# Patient Record
Sex: Male | Born: 1940 | Race: White | Hispanic: No | Smoking: Never smoker
Health system: Southern US, Community
[De-identification: ages and names within clinical notes are randomized; demographics above are authoritative.]

## PROBLEM LIST (undated history)

## (undated) DIAGNOSIS — I10 Essential (primary) hypertension: Secondary | ICD-10-CM

## (undated) DIAGNOSIS — I219 Acute myocardial infarction, unspecified: Secondary | ICD-10-CM

## (undated) DIAGNOSIS — G4730 Sleep apnea, unspecified: Secondary | ICD-10-CM

## (undated) DIAGNOSIS — E079 Disorder of thyroid, unspecified: Secondary | ICD-10-CM

## (undated) DIAGNOSIS — K2270 Barrett's esophagus without dysplasia: Secondary | ICD-10-CM

## (undated) DIAGNOSIS — K219 Gastro-esophageal reflux disease without esophagitis: Secondary | ICD-10-CM

## (undated) DIAGNOSIS — M1990 Unspecified osteoarthritis, unspecified site: Secondary | ICD-10-CM

## (undated) HISTORY — DX: Barrett's esophagus without dysplasia: K22.70

## (undated) HISTORY — PX: CARPAL TUNNEL RELEASE: SHX101

## (undated) HISTORY — DX: Essential (primary) hypertension: I10

## (undated) HISTORY — PX: BACK SURGERY: SHX140

## (undated) HISTORY — DX: Acute myocardial infarction, unspecified: I21.9

## (undated) HISTORY — PX: NISSEN FUNDOPLICATION: SHX2091

## (undated) HISTORY — DX: Gastro-esophageal reflux disease without esophagitis: K21.9

## (undated) HISTORY — DX: Sleep apnea, unspecified: G47.30

## (undated) HISTORY — DX: Disorder of thyroid, unspecified: E07.9

## (undated) HISTORY — DX: Unspecified osteoarthritis, unspecified site: M19.90

## (undated) HISTORY — PX: CORONARY ANGIOPLASTY WITH STENT PLACEMENT: SHX49

---

## 2000-06-10 ENCOUNTER — Encounter (INDEPENDENT_AMBULATORY_CARE_PROVIDER_SITE_OTHER): Payer: Self-pay

## 2000-06-10 ENCOUNTER — Other Ambulatory Visit: Admission: RE | Admit: 2000-06-10 | Discharge: 2000-06-10 | Payer: Self-pay | Admitting: Internal Medicine

## 2002-09-15 ENCOUNTER — Ambulatory Visit (HOSPITAL_COMMUNITY): Admission: RE | Admit: 2002-09-15 | Discharge: 2002-09-15 | Payer: Self-pay | Admitting: Pulmonary Disease

## 2002-12-08 ENCOUNTER — Ambulatory Visit: Admission: RE | Admit: 2002-12-08 | Discharge: 2002-12-08 | Payer: Self-pay | Admitting: Pulmonary Disease

## 2003-04-12 ENCOUNTER — Ambulatory Visit (HOSPITAL_COMMUNITY): Admission: RE | Admit: 2003-04-12 | Discharge: 2003-04-12 | Payer: Self-pay | Admitting: Pulmonary Disease

## 2003-04-22 ENCOUNTER — Ambulatory Visit (HOSPITAL_COMMUNITY): Admission: RE | Admit: 2003-04-22 | Discharge: 2003-04-22 | Payer: Self-pay | Admitting: Pulmonary Disease

## 2004-01-20 ENCOUNTER — Ambulatory Visit (HOSPITAL_COMMUNITY): Admission: RE | Admit: 2004-01-20 | Discharge: 2004-01-20 | Payer: Self-pay | Admitting: Pulmonary Disease

## 2004-02-09 ENCOUNTER — Ambulatory Visit: Payer: Self-pay | Admitting: Orthopedic Surgery

## 2004-05-03 ENCOUNTER — Ambulatory Visit: Payer: Self-pay | Admitting: Orthopedic Surgery

## 2004-05-16 ENCOUNTER — Ambulatory Visit (HOSPITAL_COMMUNITY): Admission: RE | Admit: 2004-05-16 | Discharge: 2004-05-16 | Payer: Self-pay | Admitting: Orthopedic Surgery

## 2004-05-21 ENCOUNTER — Ambulatory Visit: Payer: Self-pay | Admitting: Orthopedic Surgery

## 2004-07-11 ENCOUNTER — Ambulatory Visit: Payer: Self-pay | Admitting: Internal Medicine

## 2004-07-19 ENCOUNTER — Ambulatory Visit: Payer: Self-pay | Admitting: *Deleted

## 2004-07-19 ENCOUNTER — Ambulatory Visit (HOSPITAL_COMMUNITY): Admission: RE | Admit: 2004-07-19 | Discharge: 2004-07-19 | Payer: Self-pay | Admitting: Pulmonary Disease

## 2004-08-21 ENCOUNTER — Encounter (INDEPENDENT_AMBULATORY_CARE_PROVIDER_SITE_OTHER): Payer: Self-pay | Admitting: Specialist

## 2004-08-21 ENCOUNTER — Ambulatory Visit: Payer: Self-pay | Admitting: Internal Medicine

## 2004-08-21 HISTORY — PX: UPPER GASTROINTESTINAL ENDOSCOPY: SHX188

## 2004-09-04 ENCOUNTER — Encounter: Admission: RE | Admit: 2004-09-04 | Discharge: 2004-09-19 | Payer: Self-pay | Admitting: Chiropractic Medicine

## 2004-09-07 ENCOUNTER — Encounter: Admission: RE | Admit: 2004-09-07 | Discharge: 2004-09-07 | Payer: Self-pay | Admitting: Sports Medicine

## 2004-10-22 ENCOUNTER — Encounter: Admission: RE | Admit: 2004-10-22 | Discharge: 2004-10-22 | Payer: Self-pay | Admitting: Sports Medicine

## 2004-11-06 ENCOUNTER — Encounter: Admission: RE | Admit: 2004-11-06 | Discharge: 2004-11-06 | Payer: Self-pay | Admitting: Sports Medicine

## 2004-11-20 ENCOUNTER — Encounter: Admission: RE | Admit: 2004-11-20 | Discharge: 2004-11-20 | Payer: Self-pay | Admitting: Sports Medicine

## 2004-12-17 ENCOUNTER — Ambulatory Visit (HOSPITAL_COMMUNITY): Admission: RE | Admit: 2004-12-17 | Discharge: 2004-12-17 | Payer: Self-pay | Admitting: Orthopedic Surgery

## 2005-07-22 ENCOUNTER — Ambulatory Visit (HOSPITAL_COMMUNITY): Admission: RE | Admit: 2005-07-22 | Discharge: 2005-07-22 | Payer: Self-pay | Admitting: Pulmonary Disease

## 2006-04-16 ENCOUNTER — Ambulatory Visit (HOSPITAL_COMMUNITY): Admission: RE | Admit: 2006-04-16 | Discharge: 2006-04-16 | Payer: Self-pay | Admitting: Pulmonary Disease

## 2006-11-24 ENCOUNTER — Ambulatory Visit (HOSPITAL_COMMUNITY): Admission: RE | Admit: 2006-11-24 | Discharge: 2006-11-24 | Payer: Self-pay | Admitting: Pulmonary Disease

## 2008-01-28 HISTORY — PX: UPPER GASTROINTESTINAL ENDOSCOPY: SHX188

## 2008-05-18 ENCOUNTER — Ambulatory Visit (HOSPITAL_COMMUNITY): Admission: RE | Admit: 2008-05-18 | Discharge: 2008-05-18 | Payer: Self-pay | Admitting: Pulmonary Disease

## 2008-06-28 ENCOUNTER — Ambulatory Visit: Payer: Self-pay | Admitting: Cardiology

## 2008-06-28 ENCOUNTER — Encounter: Payer: Self-pay | Admitting: Family Medicine

## 2008-06-28 ENCOUNTER — Inpatient Hospital Stay (HOSPITAL_COMMUNITY): Admission: AD | Admit: 2008-06-28 | Discharge: 2008-07-01 | Payer: Self-pay | Admitting: Internal Medicine

## 2008-07-15 ENCOUNTER — Ambulatory Visit: Payer: Self-pay | Admitting: Cardiology

## 2008-08-25 ENCOUNTER — Encounter (INDEPENDENT_AMBULATORY_CARE_PROVIDER_SITE_OTHER): Payer: Self-pay | Admitting: *Deleted

## 2008-08-25 LAB — CONVERTED CEMR LAB
ALT: 16 units/L
ALT: 16 units/L
AST: 15 units/L
AST: 15 units/L
Albumin: 4.4 g/dL
Albumin: 4.4 g/dL
Alkaline Phosphatase: 59 units/L
Alkaline Phosphatase: 59 units/L
BUN: 20 mg/dL
BUN: 20 mg/dL
CO2: 28 meq/L
CO2: 28 meq/L
Calcium: 9.3 mg/dL
Calcium: 9.3 mg/dL
Chloride: 102 meq/L
Chloride: 102 meq/L
Cholesterol: 94 mg/dL
Cholesterol: 94 mg/dL
Creatinine, Ser: 0.81 mg/dL
Creatinine, Ser: 0.81 mg/dL
Glucose, Bld: 162 mg/dL
Glucose, Bld: 162 mg/dL
HDL: 29 mg/dL
HDL: 29 mg/dL
LDL Cholesterol: 47 mg/dL
LDL Cholesterol: 47 mg/dL
Potassium: 3.7 meq/L
Potassium: 3.7 meq/L
Sodium: 141 meq/L
Sodium: 141 meq/L
Total Protein: 7.1 g/dL
Total Protein: 7.1 g/dL
Triglycerides: 90 mg/dL
Triglycerides: 90 mg/dL

## 2008-08-30 DIAGNOSIS — E039 Hypothyroidism, unspecified: Secondary | ICD-10-CM | POA: Insufficient documentation

## 2008-08-30 DIAGNOSIS — E119 Type 2 diabetes mellitus without complications: Secondary | ICD-10-CM

## 2008-08-30 DIAGNOSIS — M159 Polyosteoarthritis, unspecified: Secondary | ICD-10-CM

## 2008-08-30 DIAGNOSIS — G4730 Sleep apnea, unspecified: Secondary | ICD-10-CM | POA: Insufficient documentation

## 2008-08-30 DIAGNOSIS — K2270 Barrett's esophagus without dysplasia: Secondary | ICD-10-CM | POA: Insufficient documentation

## 2008-08-30 DIAGNOSIS — I219 Acute myocardial infarction, unspecified: Secondary | ICD-10-CM | POA: Insufficient documentation

## 2008-08-30 DIAGNOSIS — I10 Essential (primary) hypertension: Secondary | ICD-10-CM | POA: Insufficient documentation

## 2008-09-28 ENCOUNTER — Emergency Department (HOSPITAL_COMMUNITY): Admission: EM | Admit: 2008-09-28 | Discharge: 2008-09-28 | Payer: Self-pay | Admitting: Emergency Medicine

## 2009-01-27 ENCOUNTER — Encounter: Payer: Self-pay | Admitting: Cardiology

## 2009-01-27 ENCOUNTER — Inpatient Hospital Stay (HOSPITAL_COMMUNITY): Admission: EM | Admit: 2009-01-27 | Discharge: 2009-01-31 | Payer: Self-pay | Admitting: Emergency Medicine

## 2009-01-27 ENCOUNTER — Ambulatory Visit: Payer: Self-pay | Admitting: Cardiology

## 2009-01-30 ENCOUNTER — Encounter: Payer: Self-pay | Admitting: Cardiology

## 2009-02-10 ENCOUNTER — Encounter: Payer: Self-pay | Admitting: Cardiology

## 2009-02-13 ENCOUNTER — Encounter: Payer: Self-pay | Admitting: Cardiology

## 2009-03-29 ENCOUNTER — Ambulatory Visit: Payer: Self-pay | Admitting: Cardiovascular Disease

## 2009-03-29 ENCOUNTER — Encounter: Payer: Self-pay | Admitting: Internal Medicine

## 2009-03-29 ENCOUNTER — Encounter: Payer: Self-pay | Admitting: Emergency Medicine

## 2009-03-29 ENCOUNTER — Inpatient Hospital Stay (HOSPITAL_COMMUNITY): Admission: EM | Admit: 2009-03-29 | Discharge: 2009-04-04 | Payer: Self-pay | Admitting: Internal Medicine

## 2009-04-09 ENCOUNTER — Emergency Department (HOSPITAL_COMMUNITY): Admission: EM | Admit: 2009-04-09 | Discharge: 2009-04-09 | Payer: Self-pay | Admitting: Emergency Medicine

## 2009-04-10 ENCOUNTER — Telehealth (INDEPENDENT_AMBULATORY_CARE_PROVIDER_SITE_OTHER): Payer: Self-pay | Admitting: *Deleted

## 2009-04-13 ENCOUNTER — Encounter: Payer: Self-pay | Admitting: Adult Health

## 2009-04-13 ENCOUNTER — Ambulatory Visit: Payer: Self-pay | Admitting: Cardiovascular Disease

## 2009-04-13 DIAGNOSIS — I2510 Atherosclerotic heart disease of native coronary artery without angina pectoris: Secondary | ICD-10-CM

## 2009-04-13 DIAGNOSIS — I5032 Chronic diastolic (congestive) heart failure: Secondary | ICD-10-CM

## 2009-04-13 DIAGNOSIS — I429 Cardiomyopathy, unspecified: Secondary | ICD-10-CM

## 2009-05-22 ENCOUNTER — Encounter (INDEPENDENT_AMBULATORY_CARE_PROVIDER_SITE_OTHER): Payer: Self-pay | Admitting: *Deleted

## 2009-05-26 ENCOUNTER — Ambulatory Visit: Payer: Self-pay | Admitting: Cardiology

## 2009-05-26 ENCOUNTER — Ambulatory Visit (HOSPITAL_COMMUNITY): Admission: RE | Admit: 2009-05-26 | Discharge: 2009-05-26 | Payer: Self-pay | Admitting: Pulmonary Disease

## 2009-06-08 ENCOUNTER — Encounter (INDEPENDENT_AMBULATORY_CARE_PROVIDER_SITE_OTHER): Payer: Self-pay | Admitting: *Deleted

## 2009-06-09 ENCOUNTER — Encounter (INDEPENDENT_AMBULATORY_CARE_PROVIDER_SITE_OTHER): Payer: Self-pay | Admitting: *Deleted

## 2009-06-09 ENCOUNTER — Encounter: Payer: Self-pay | Admitting: Adult Health

## 2009-06-09 ENCOUNTER — Ambulatory Visit: Payer: Self-pay | Admitting: Cardiology

## 2009-06-09 DIAGNOSIS — R609 Edema, unspecified: Secondary | ICD-10-CM

## 2009-06-09 LAB — CONVERTED CEMR LAB
BUN: 17 mg/dL
BUN: 17 mg/dL (ref 6–23)
Brain Natriuretic Peptide: 74.4
CO2: 25 meq/L
CO2: 25 meq/L (ref 19–32)
Calcium: 9.5 mg/dL
Calcium: 9.5 mg/dL (ref 8.4–10.5)
Chloride: 103 meq/L
Chloride: 103 meq/L (ref 96–112)
Creatinine, Ser: 0.69 mg/dL
Creatinine, Ser: 0.69 mg/dL (ref 0.40–1.50)
Glucose, Bld: 116 mg/dL
Glucose, Bld: 116 mg/dL — ABNORMAL HIGH (ref 70–99)
Potassium: 3.8 meq/L
Potassium: 3.8 meq/L (ref 3.5–5.3)
Pro B Natriuretic peptide (BNP): 74.4 pg/mL (ref 0.0–100.0)
Sodium: 140 meq/L
Sodium: 140 meq/L (ref 135–145)

## 2009-07-07 ENCOUNTER — Encounter (INDEPENDENT_AMBULATORY_CARE_PROVIDER_SITE_OTHER): Payer: Self-pay | Admitting: *Deleted

## 2009-07-11 ENCOUNTER — Telehealth (INDEPENDENT_AMBULATORY_CARE_PROVIDER_SITE_OTHER): Payer: Self-pay

## 2009-07-14 ENCOUNTER — Ambulatory Visit: Payer: Self-pay | Admitting: Cardiology

## 2009-07-24 ENCOUNTER — Telehealth: Payer: Self-pay | Admitting: Cardiology

## 2009-08-17 ENCOUNTER — Encounter (INDEPENDENT_AMBULATORY_CARE_PROVIDER_SITE_OTHER): Payer: Self-pay | Admitting: *Deleted

## 2009-09-11 ENCOUNTER — Telehealth (INDEPENDENT_AMBULATORY_CARE_PROVIDER_SITE_OTHER): Payer: Self-pay

## 2009-09-11 ENCOUNTER — Encounter: Payer: Self-pay | Admitting: Cardiology

## 2009-10-06 ENCOUNTER — Ambulatory Visit: Payer: Self-pay | Admitting: Cardiology

## 2009-10-06 DIAGNOSIS — E785 Hyperlipidemia, unspecified: Secondary | ICD-10-CM | POA: Insufficient documentation

## 2009-11-15 ENCOUNTER — Telehealth: Payer: Self-pay | Admitting: Cardiology

## 2010-01-10 ENCOUNTER — Telehealth: Payer: Self-pay | Admitting: Cardiology

## 2010-01-23 ENCOUNTER — Ambulatory Visit: Payer: Self-pay | Admitting: Cardiology

## 2010-01-24 ENCOUNTER — Telehealth (INDEPENDENT_AMBULATORY_CARE_PROVIDER_SITE_OTHER): Payer: Self-pay

## 2010-01-24 LAB — CONVERTED CEMR LAB
BUN: 13 mg/dL (ref 6–23)
CO2: 30 meq/L (ref 19–32)
Calcium: 9.2 mg/dL (ref 8.4–10.5)
Chloride: 101 meq/L (ref 96–112)
Creatinine, Ser: 0.68 mg/dL (ref 0.40–1.50)
Glucose, Bld: 125 mg/dL — ABNORMAL HIGH (ref 70–99)
Potassium: 3.9 meq/L (ref 3.5–5.3)
Sodium: 142 meq/L (ref 135–145)

## 2010-01-25 ENCOUNTER — Encounter: Payer: Self-pay | Admitting: Cardiology

## 2010-01-30 ENCOUNTER — Telehealth: Payer: Self-pay | Admitting: Cardiology

## 2010-03-14 ENCOUNTER — Encounter: Payer: Self-pay | Admitting: Cardiology

## 2010-04-16 ENCOUNTER — Ambulatory Visit
Admission: RE | Admit: 2010-04-16 | Discharge: 2010-04-16 | Payer: Self-pay | Source: Home / Self Care | Attending: Internal Medicine | Admitting: Internal Medicine

## 2010-04-23 ENCOUNTER — Ambulatory Visit (HOSPITAL_COMMUNITY)
Admission: RE | Admit: 2010-04-23 | Discharge: 2010-04-23 | Payer: Self-pay | Source: Home / Self Care | Attending: Internal Medicine | Admitting: Internal Medicine

## 2010-04-27 ENCOUNTER — Encounter: Payer: Self-pay | Admitting: Cardiology

## 2010-05-01 NOTE — Letter (Signed)
Summary: Handout Printed  Printed Handout:  - Diet - Sodium-Controlled 

## 2010-05-01 NOTE — Assessment & Plan Note (Signed)
Summary: 1 MTH F/U PER CHECKOUT ON 06/09/09/TG   Visit Type:  Follow-up Primary Provider:  DR.EDWARD HAWKINS  CC:  No Cardiology complaints today.  History of Present Illness: Mr Johnny [name] comes in today for followup of his chronic diastolic heart failure, lower shin edema, hypertension, and coronary artery disease.  Since his last visit in March, he's lost additional pounds. That now makes 12 pounds since I saw him several months ago.  His last blood work showed a BNP of 74 and his electrolytes were stable.  He is having no symptoms other than some occasional shortness of breath. He does have some swelling in his lower 70s but is markedly improved. His wife has been very helpful and weighing him and managing his meds.  He denies any chest pain or angina. He's had no palpitations or syncope.  Current Medications (verified): 1)  Diovan 80 Mg Tabs (Valsartan) .... Take 1 Tablet By Mouth Once Daily 2)  Alprazolam 0.5 Mg Tabs (Alprazolam) .... Take 1` Tab Three Times A Day 3)  Percocet 10-325 Mg Tabs (Oxycodone-Acetaminophen) .... Take Asneeded 4)  Amlodipine Besylate 10 Mg Tabs (Amlodipine Besylate) .... Take 1 Tab Daily 5)  Morphine Sulfate 30 Mg Tabs (Morphine Sulfate) .... Take 1 Tab Three Times A Day 6)  Plavix 75 Mg Tabs (Clopidogrel Bisulfate) .... Take 1 Tab Daily 7)  Lopressor 50 Mg Tabs (Metoprolol Tartrate) .... Take 1 Tab Daily 8)  Metformin Hcl 500 Mg Tabs (Metformin Hcl) .... Take 1 Tab Two Times A Day 9)  Furosemide 40 Mg Tabs (Furosemide) .... Take 1 Tablet By Mouth Once Daily 10)  Ecotrin 325 Mg Tbec (Aspirin) .... Take 1 Tab Daily 11)  Klor-Con M10 10 Meq Cr-Tabs (Potassium Chloride Crys Cr) .... Take 2 Tablets By Mouth Two Times A Day 12)  Actos 30 Mg Tabs (Pioglitazone Hcl) .... Take 1 Tab Daily 13)  Simvastatin 40 Mg Tabs (Simvastatin) .... Take 1 Tab Daily 14)  Fish Oil 1000 Mg Caps (Omega-3 Fatty Acids) .... Take 1 Tab Daily 15)  Seroquel 50 Mg Tabs (Quetiapine  Fumarate) .... Take 1 Tab Daily 16)  Ibuprofen 200 Mg Tabs (Ibuprofen) .... Take As Needed 17)  Nitrostat 0.4 Mg Subl (Nitroglycerin) .... 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  Alcohol Use - no Drug Use - no  Risk Factors: Alcohol Use: 0 (04/13/2009)  Risk Factors: Smoking Status: quit > 6 months (04/13/2009)  Review of Systems       negative other than history of present illness  Vital Signs:  Patient profile:   70 year old male Weight:      258 pounds Pulse rate:   76 / minute BP sitting:   145 / 88  (right arm)  Vitals Entered By: Dreama Saa, CNA (July 14, 2009 1:11 PM)  Physical Exam  General:  obese.   Head:  normocephalic and atraumatic Eyes:  PERRLA/EOM intact; conjunctiva and lids normal. Neck:  Neck supple, no JVD. No masses, thyromegaly or abnormal cervical nodes. Chest Kathlee Barnhardt:  no deformities or breast masses noted  Lungs:  Clear bilaterally to auscultation and percussion. Heart:  PMI poorly appreciated, irregular rate and rhythm, normal S1-S2, no carotid bruits, Msk:  decreased ROM.   Pulses:  pulses normal in all 4 extremities Extremities:  1+ left pedal edema and 1+ right pedal edema.   Neurologic:  Alert and oriented x 3. Skin:  Intact without lesions or rashes. Psych:  anxious.     EKG  Procedure date:  07/14/2009  Findings:      normal sinus  rhythm with PACs , no change  Impression & Recommendations:  Problem # 1:  EDEMA (ICD-782.3) Assessment Improved No change in his diuretics. Elevating legs again reinforced. Salt restriction and continue to weigh himself reinforced.  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 Take 1 tab daily    Plavix 75 Mg Tabs (Clopidogrel bisulfate) .Marland Kitchen... Take 1 tab daily    Lopressor 50 Mg Tabs (Metoprolol tartrate) .Marland Kitchen... Take 1 tab daily    Ecotrin 325 Mg Tbec (Aspirin) .Marland Kitchen... Take 1 tab daily    Nitrostat 0.4 Mg Subl (Nitroglycerin) .Marland Kitchen... Take as directed for chest pain  Problem # 3:  DIASTOLIC HEART FAILURE, CHRONIC (ICD-428.32) Assessment: Improved  His updated medication list for this problem includes:    Diovan 80 Mg Tabs (Valsartan) .Marland Kitchen... Take 1 tablet by mouth once daily    Amlodipine Besylate 10 Mg Tabs (Amlodipine besylate) .Marland Kitchen... Take 1 tab daily    Plavix 75 Mg Tabs (Clopidogrel bisulfate) .Marland Kitchen... Take 1 tab daily    Lopressor 50 Mg Tabs (Metoprolol tartrate) .Marland Kitchen... Take 1 tab daily    Furosemide 40 Mg Tabs (Furosemide) .Marland Kitchen... Take 1 tablet by mouth once daily    Ecotrin 325 Mg Tbec (Aspirin) .Marland Kitchen... Take 1 tab daily    Nitrostat 0.4 Mg Subl (Nitroglycerin) .Marland Kitchen... Take as directed for chest pain  Problem # 4:  HYPERTENSION (ICD-401.9) Assessment: Unchanged  His updated medication list for this problem includes:    Diovan 80 Mg Tabs (Valsartan) .Marland Kitchen... Take 1 tablet by mouth once daily    Amlodipine Besylate 10 Mg Tabs (Amlodipine besylate) .Marland Kitchen... Take 1 tab daily    Lopressor 50 Mg Tabs (Metoprolol tartrate) .Marland Kitchen... Take 1 tab daily    Furosemide 40 Mg Tabs (Furosemide) .Marland Kitchen... Take 1 tablet by mouth once daily    Ecotrin 325 Mg Tbec (Aspirin) .Marland Kitchen... Take 1 tab daily  Patient Instructions: 1)  Your physician recommends that you  schedule a follow-up appointment in:  2)  Your physician recommends that you continue on your current medications as directed. Please refer to the Current Medication list given to you today. 3)  Your physician recommends that you weigh, daily, at the same time every day, and in the same amount of clothing.  Please record your daily weights on the handout provided and bring it to your next appointment. 4)  Please read the handout/brochure that you were given about Low salt diet

## 2010-05-01 NOTE — Progress Notes (Signed)
**Note De-Identified [name] Obfuscation** Summary: PT TOOK MEDS TWICE THIS AM  Phone Note Call from Patient Call back at Home Phone [phone]   Caller: PT Reason for Call: Talk to Nurse Summary of Call: PT TOOK DOUBLE MEDS THIS MORNING ONCE AT 7:30 AND AGAIN AT 12:00 OF FUROSIMIDE 40MG , MEFORMIN, PLAVIX, METROPRLOL AND K Initial call taken by: [name],  July 11, 2009 12:20 PM  Follow-up for Phone Call        Pt's wife states that pt. got confused and took morning meds twice this morning. [name] advised to monitor pt's BP and BS at least once an hour for 4 hrs. and to take him to ER if his SBP drops to 90 and/or BS drops to 90. [name] states she understands instructions given. Follow-up by: [name] LPN,  July 11, 2009 1:08 PM     Appended Document: PT TOOK MEDS TWICE THIS AM agree. eat some extra potassium rich foods today.  Appended Document: PT TOOK MEDS TWICE THIS AM PT'S WIFE AWARE./CY

## 2010-05-01 NOTE — Progress Notes (Signed)
Summary: pain at cath site  Phone Note Call from Patient Call back at Home Phone [phone]   Reason for Call: Talk to Nurse Summary of Call: pt had cath last monday and has been having a hard time sitting up with out pain. Was seen at [facility] last night and everything looked good to them was told to call us this am. Patient cannot sit up still with out pain he has had two caths in past and this has never happened.  Pt wife calling back because they need for Dr Daleen Squibb to call Micadis 80mg  [facility] [address] [facility] [place] [phone]. Insurece was dening because another doctor from hospital called it in. Initial call taken by: [name],  April 10, 2009 9:20 AM    New/Updated Medications: MICARDIS 80 MG TABS (TELMISARTAN) Take one tablet by mouth daily Prescriptions: MICARDIS 80 MG TABS (TELMISARTAN) Take one tablet by mouth daily  #30 x 6   Entered by:   Teressa Lower RN   Authorized by:   Gaylord Shih, MD, [facility]   Signed by:   Teressa Lower RN on 04/10/2009   Method used:   Electronically to        [facility]* (retail)       [address]       [place], [place]  [address]       Ph: [phone]       Fax: [phone]   RxID:   [id number]   Appended Document: pain at cath site Pt is still in pain can not sit up at all and ice is not working, has appt scheduled for 04/13/09 at 2pm.

## 2010-05-01 NOTE — Assessment & Plan Note (Signed)
Summary: 1 mth f/u per checkout on 04/13/09/tg   Visit Type:  Follow-up Primary Provider:  DR.EDWARD HAWKINS  CC:  occasional sob.  History of Present Illness: Johnny [name] comes in today for close followup of his chronic diastolic heart failure.  His [facility] the office here January 13, he has gained back an additional 8 pounds. His weight today is 270. He had lost 5 pounds since his discharge at his last visit.  He feels his abdominal region is swollen as well. His bowels are moving normally. He denies any nausea or vomiting but his wife says his appetite has not been good. He has had increased swelling in his legs.  His wife says he would not let her wake him in the mornings. I reinforced that he let her do so. He states he is compliant with his medications.  Current Medications (verified): 1)  Diovan 80 Mg Tabs (Valsartan) .... Take 1 Tablet By Mouth Once Daily 2)  Alprazolam 0.5 Mg Tabs (Alprazolam) .... Take 1` Tab Three Times A Day 3)  Percocet 10-325 Mg Tabs (Oxycodone-Acetaminophen) .... Take Asneeded 4)  Amlodipine Besylate 10 Mg Tabs (Amlodipine Besylate) .... Take 1 Tab Daily 5)  Morphine Sulfate 30 Mg Tabs (Morphine Sulfate) .... Take 1 Tab Two Times A Day 6)  Plavix 75 Mg Tabs (Clopidogrel Bisulfate) .... Take 1 Tab Daily 7)  Lopressor 50 Mg Tabs (Metoprolol Tartrate) .... Take 1 Tab Daily 8)  Metformin Hcl 500 Mg Tabs (Metformin Hcl) .... Take 1 Tab Two Times A Day 9)  Furosemide 40 Mg Tabs (Furosemide) .... Take 1 Tablet By Mouth Once Daily 10)  Ecotrin 325 Mg Tbec (Aspirin) .... Take 1 Tab Daily 11)  Klor-Con M10 10 Meq Cr-Tabs (Potassium Chloride Crys Cr) .... Take 2 Tablets By Mouth Two Times A Day 12)  Actos 30 Mg Tabs (Pioglitazone Hcl) .... Take 1 Tab Daily 13)  Simvastatin 40 Mg Tabs (Simvastatin) .... Take 1 Tab Daily 14)  Fish Oil 1000 Mg Caps (Omega-3 Fatty Acids) .... Take 1 Tab Daily 15)  Seroquel 50 Mg Tabs (Quetiapine Fumarate) .... Take 1 Tab Daily 16)   Ibuprofen 200 Mg Tabs (Ibuprofen) .... 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  Alcohol Use - no Drug Use - no  Risk Factors: Alcohol Use: 0 (04/13/2009)  Risk Factors: Smoking Status: quit > 6 months (04/13/2009)  Review of Systems       negative other than history of present illness  Vital Signs:  Patient profile:   70 year old male Weight:      270 pounds Pulse rate:   62 / minute BP sitting:   162 / 86  (right arm)  Vitals Entered By: Johnny Saa, CNA (May 26, 2009 1:21 PM)  Physical Exam  General:  obese.  chronically ill-appearingobese.   Head:  normocephalic and atraumatic Eyes:   sclera are injected Neck:  Neck supple, no JVD. No masses, thyromegaly or abnormal cervical nodes. Lungs:  Clear bilaterally to auscultation and percussion. Heart:  Pdifficult to appreciate, normal S1-S2, no Abdomen:  non-tense, distended,  positive bowel sounds, no tenderness Msk:  decreased ROM.  decreased ROM.   Pulses:  pulses normal in all 4 extremities Extremities:  2+ left pedal edema and 2+ right pedal edema.  2+ left pedal edema.   Neurologic:  Alert and oriented x 3. Skin:  Intact without lesions or rashes. Psych:  depressed affect.  depressed affect.     Impression & Recommendations:  Problem # 1:  DIASTOLIC HEART FAILURE, CHRONIC (ICD-428.32) He has gained 8 pounds since his last visit  4 weeks ago. Await recent furosemide to 40 mg each morning and double his potassium. He will weigh daily. If his weight further, and his wife call back for further instructions. I'll set him up for close followup in the office in 2 weeks. His updated medication list for this problem includes:    Diovan 80 Mg Tabs (Valsartan) .Marland Kitchen... Take 1 tablet by mouth once daily    Amlodipine Besylate 10 Mg Tabs (Amlodipine besylate) .Marland Kitchen... Take 1 tab daily    Plavix 75 Mg Tabs (Clopidogrel bisulfate) .Marland Kitchen... Take 1 tab daily    Lopressor 50 Mg Tabs (Metoprolol tartrate) .Marland Kitchen... Take 1 tab daily    Furosemide 40 Mg Tabs (Furosemide) .Marland Kitchen... Take 1 tablet by mouth once daily    Ecotrin 325 Mg Tbec (Aspirin) .Marland Kitchen... Take 1 tab daily  Problem # 2:  CARDIOMYOPATHY, SECONDARY (ICD-425.9) Assessment: Unchanged  His updated medication list for this problem includes:    Diovan 80 Mg Tabs (Valsartan) .Marland Kitchen... Take 1 tablet by mouth once daily    Amlodipine Besylate 10 Mg Tabs (Amlodipine besylate) .Marland Kitchen... Take 1 tab daily    Plavix 75 Mg Tabs (Clopidogrel bisulfate) .Marland Kitchen... Take 1 tab daily    Lopressor 50 Mg Tabs (Metoprolol tartrate) .Marland Kitchen... Take 1 tab daily    Furosemide 40 Mg Tabs (Furosemide) .Marland Kitchen... Take 1 tablet by mouth once daily    Ecotrin 325 Mg Tbec (Aspirin) .Marland Kitchen... Take 1 tab daily  His updated medication list for this problem includes:    Diovan 80 Mg Tabs (Valsartan) .Marland Kitchen... Take 1 tablet by mouth once daily    Amlodipine Besylate 10 Mg Tabs (Amlodipine besylate) .Marland Kitchen... Take 1 tab daily    Plavix 75 Mg Tabs (Clopidogrel bisulfate) .Marland Kitchen... Take 1 tab daily    Lopressor 50 Mg Tabs (Metoprolol tartrate) .Marland Kitchen... Take 1 tab daily    Furosemide 40 Mg Tabs (Furosemide) .Marland Kitchen... Take 1 tablet by mouth once daily    Ecotrin 325 Mg Tbec (Aspirin) .Marland Kitchen... Take 1 tab daily  Problem # 3:  HYPERTENSION (ICD-401.9) Assessment: Deteriorated  His updated medication list for this problem includes:    Diovan 80 Mg Tabs (Valsartan) .Marland Kitchen... Take 1  tablet by mouth once daily    Amlodipine Besylate 10 Mg Tabs (Amlodipine besylate) .Marland Kitchen... Take 1 tab daily    Lopressor 50 Mg Tabs (Metoprolol tartrate) .Marland Kitchen... Take 1 tab daily    Furosemide 40 Mg Tabs (Furosemide) .Marland Kitchen... Take 1 tablet by mouth once daily    Ecotrin 325 Mg Tbec (Aspirin) .Marland Kitchen... Take 1 tab daily  His updated medication list for this problem includes:    Diovan 80 Mg Tabs (Valsartan) .Marland Kitchen... Take 1 tablet by mouth once daily    Amlodipine Besylate 10 Mg Tabs (Amlodipine besylate) .Marland Kitchen... Take 1 tab daily    Lopressor 50 Mg Tabs (Metoprolol tartrate) .Marland Kitchen... Take 1 tab daily    Furosemide 40 Mg Tabs (Furosemide) .Marland Kitchen... Take 1 tablet by mouth once daily    Ecotrin 325  Mg Tbec (Aspirin) .Marland Kitchen... Take 1 tab daily  Problem # 4:  CORONARY ATHEROSCLEROSIS, NATIVE VESSEL (ICD-414.01) Assessment: Unchanged  His updated medication list for this problem includes:    Amlodipine Besylate 10 Mg Tabs (Amlodipine besylate) .Marland Kitchen... Take 1 tab daily    Plavix 75 Mg Tabs (Clopidogrel bisulfate) .Marland Kitchen... Take 1 tab daily    Lopressor 50 Mg Tabs (Metoprolol tartrate) .Marland Kitchen... Take 1 tab daily    Ecotrin 325 Mg Tbec (Aspirin) .Marland Kitchen... Take 1 tab daily  His updated medication list for this problem includes:    Amlodipine Besylate 10 Mg Tabs (Amlodipine besylate) .Marland Kitchen... Take 1 tab daily    Plavix 75 Mg Tabs (Clopidogrel bisulfate) .Marland Kitchen... Take 1 tab daily    Lopressor 50 Mg Tabs (Metoprolol tartrate) .Marland Kitchen... Take 1 tab daily    Ecotrin 325 Mg Tbec (Aspirin) .Marland Kitchen... Take 1 tab daily  Patient Instructions: 1)  Your physician recommends that you schedule a follow-up appointment in: 2 weeks 2)  Your physician has recommended you make the following change in your medication: Increase Furosemide to 40 mg by mouth once daily (take an extra 20mg  tablet tonight) and increase Klor-con (potassium) to 2 tablets by mouth two times a day  3)  ***please weigh daily at the same time each day. If you gain 3 pounds please call this office.  Please listen to your wife. Prescriptions: KLOR-CON M10 10 MEQ CR-TABS (POTASSIUM CHLORIDE CRYS CR) TAKE 2 tablets by mouth two times a day  #120 x 6   Entered by:   Johnny [name]   Authorized by:   Gaylord Shih, MD, [facility]   Signed by:   Johnny [name] on 16/12/9602   Method used:   [facility]  [address] [address] [id number]       [address]       [place], [place]  [address]       Ph: [phone] or [phone]       Fax: [phone]   RxID:   [id number] FUROSEMIDE 40 MG TABS (FUROSEMIDE) take 1 tablet by mouth once daily  #30 x 6   Entered by:   Johnny [name]   Authorized by:   Gaylord Shih, MD, [facility]   Signed by:   Johnny [name] on 40/34/7425   Method used:   [facility]        [facility]  [address] [id number]       [address]       [place], [place]  [address]       Ph: [phone] or [phone]       Fax: [phone]   RxID:   [id number]

## 2010-05-01 NOTE — Miscellaneous (Signed)
Summary: labs cmp,lipids,08/25/2008  Clinical Lists Changes  Observations: Added new observation of CALCIUM: 9.3 mg/dL (16/12/9602 54:09) Added new observation of ALBUMIN: 4.4 g/dL (81/19/1478 29:56) Added new observation of PROTEIN, TOT: 7.1 g/dL (21/30/8657 84:69) Added new observation of SGPT (ALT): 16 units/L (08/25/2008 15:38) Added new observation of SGOT (AST): 15 units/L (08/25/2008 15:38) Added new observation of ALK PHOS: 59 units/L (08/25/2008 15:38) Added new observation of CREATININE: 0.81 mg/dL (62/95/2841 32:44) Added new observation of BUN: 20 mg/dL (04/03/7251 66:44) Added new observation of BG RANDOM: 162 mg/dL (03/47/4259 56:38) Added new observation of CO2 PLSM/SER: 28 meq/L (08/25/2008 15:38) Added new observation of CL SERUM: 102 meq/L (08/25/2008 15:38) Added new observation of K SERUM: 3.7 meq/L (08/25/2008 15:38) Added new observation of NA: 141 meq/L (08/25/2008 15:38) Added new observation of LDL: 47 mg/dL (75/64/3329 51:88) Added new observation of HDL: 29 mg/dL (41/66/0630 16:01) Added new observation of TRIGLYC TOT: 90 mg/dL (09/32/3557 32:20) Added new observation of CHOLESTEROL: 94 mg/dL (25/42/7062 37:62)

## 2010-05-01 NOTE — Progress Notes (Signed)
Summary: Concerns  Phone Note Call from Patient   Caller: Spouse Darl Pikes) Reason for Call: Talk to Nurse Summary of Call: pt's wife states his legs are really swollen / pt's wife was really upset and stated that he was getting SOB / I advised pt's wife to take pt to ER/tg Initial call taken by: Raechel Ache [facility],  September 11, 2009 8:49 AM  Follow-up for Phone Call        Mr. Roddey agreed to go to ER. He states he is having "unbearable pain in both knees which are swollen and the pain in knees is causing me to be SOB". He denies CP, nausea, diaphoresis, etc. Pt's wife will bring him to [facility] ED ASAP. ED notified.  Follow-up by: Larita Fife Via LPN,  September 11, 2009 9:32 AM

## 2010-05-01 NOTE — Miscellaneous (Signed)
Summary: labs bmp,bnp,06/09/2009  Clinical Lists Changes  Observations: Added new observation of CALCIUM: 9.5 mg/dL (14/78/2956 21:30) Added new observation of CREATININE: 0.69 mg/dL (86/57/8469 62:95) Added new observation of BUN: 17 mg/dL (28/41/3244 01:02) Added new observation of BG RANDOM: 116 mg/dL (72/53/6644 03:47) Added new observation of CO2 PLSM/SER: 25 meq/L (06/09/2009 10:59) Added new observation of CL SERUM: 103 meq/L (06/09/2009 10:59) Added new observation of K SERUM: 3.8 meq/L (06/09/2009 10:59) Added new observation of NA: 140 meq/L (06/09/2009 10:59) Added new observation of BNP: 74.4  (06/09/2009 10:59)

## 2010-05-01 NOTE — Assessment & Plan Note (Signed)
Summary: 2 wk f/u per checkout on 05/26/09/tg   Visit Type:  Follow-up Primary Provider:  DR.EDWARD HAWKINS  CC:  no complaints today.  History of Present Illness: Johnny [name] is a 70 y/o CM who we are seeing on 2 week follow-up after intiation of Lasix dose of 40mg  and increased potassium dose to daily.  He is being followed for history of chronic diastolic HF.  He was seen by Dr. Daleen Squibb and was noted at that time to have 8 lb weight gain on that visit. He is now down 11lbs.  His wife has been diligent about weighing him daily and actually doing I/O's daily, making sure that she measures his urine output by having him urinate into a urinal.  She is measuring all that he takes in as well.  He drinks about 3 liters everyday but duresis approx 1000 cc daily, with less some days and up to 2 liters other days.  His complaint is that he is thirsty all the time and can't help it that he drinks so much. His wife nags him about it alot and is very strict concerning the water he drinks.  He is breathing much better but continues to have bloating in the abdomen.  Current Medications (verified): 1)  Diovan 80 Mg Tabs (Valsartan) .... Take 1 Tablet By Mouth Once Daily 2)  Alprazolam 0.5 Mg Tabs (Alprazolam) .... Take 1` Tab Three Times A Day 3)  Percocet 10-325 Mg Tabs (Oxycodone-Acetaminophen) .... Take Asneeded 4)  Amlodipine Besylate 10 Mg Tabs (Amlodipine Besylate) .... Take 1 Tab Daily 5)  Morphine Sulfate 30 Mg Tabs (Morphine Sulfate) .... Take 1 Tab Three Times A Day 6)  Plavix 75 Mg Tabs (Clopidogrel Bisulfate) .... Take 1 Tab Daily 7)  Lopressor 50 Mg Tabs (Metoprolol Tartrate) .... Take 1 Tab Daily 8)  Metformin Hcl 500 Mg Tabs (Metformin Hcl) .... Take 1 Tab Two Times A Day 9)  Furosemide 40 Mg Tabs (Furosemide) .... Take 1 Tablet By Mouth Once Daily 10)  Ecotrin 325 Mg Tbec (Aspirin) .... Take 1 Tab Daily 11)  Klor-Con M10 10 Meq Cr-Tabs (Potassium Chloride Crys Cr) .... Take 2 Tablets By  Mouth Two Times A Day 12)  Actos 30 Mg Tabs (Pioglitazone Hcl) .... Take 1 Tab Daily 13)  Simvastatin 40 Mg Tabs (Simvastatin) .... Take 1 Tab Daily 14)  Fish Oil 1000 Mg Caps (Omega-3 Fatty Acids) .... Take 1 Tab Daily 15)  Seroquel 50 Mg Tabs (Quetiapine Fumarate) .... Take 1 Tab Daily 16)  Ibuprofen 200 Mg Tabs (Ibuprofen) .... Take As Needed  Allergies (verified): No Known Drug Allergies  Review of Systems       Thirst and abdominal bloating.  All other systems have been reviewed and are negative unless stated above.   Vital Signs:  Patient profile:   70 year old male Weight:      259 pounds Pulse rate:   58 / minute BP sitting:   155 / 85  (right arm)  Vitals Entered By: Dreama Saa, CNA (June 09, 2009 1:07 PM)  Physical Exam  General:  Well developed, well nourished, in no acute distress. Lungs:  Clear bilaterally to auscultation and percussion. Heart:  Distant with regular rhythm Abdomen:  Obese, mild distention Extremities:  1+-2+ pitting edema bilaterally in the dependent position.   Impression & Recommendations:  Problem # 1:  DIASTOLIC HEART FAILURE, CHRONIC (ICD-428.32) He is diuresing fairly well with Lasix dose.  I have encouraged him to decrease  the amount of water he drinks if possible so that we are not putting back the fluid that he is diuresing.  I have congratulated his wife for her dilgence of I/O which is better than usually occurs in the hospital setting.  We will have a BMET and BNP drawn. He will follow -up with Dr. Daleen Squibb in one month. His updated medication list for this problem includes:    Diovan 80 Mg Tabs (Valsartan) .Marland Kitchen... Take 1 tablet by mouth once daily    Amlodipine Besylate 10 Mg Tabs (Amlodipine besylate) .Marland Kitchen... Take 1 tab daily    Plavix 75 Mg Tabs (Clopidogrel bisulfate) .Marland Kitchen... Take 1 tab daily    Lopressor 50 Mg Tabs (Metoprolol tartrate) .Marland Kitchen... Take 1 tab daily    Furosemide 40 Mg Tabs (Furosemide) .Marland Kitchen... Take 1 tablet by mouth once  daily    Ecotrin 325 Mg Tbec (Aspirin) .Marland Kitchen... Take 1 tab daily  Problem # 2:  CORONARY ATHEROSCLEROSIS, NATIVE VESSEL (ICD-414.01) Assessment: Unchanged  His updated medication list for this problem includes:    Amlodipine Besylate 10 Mg Tabs (Amlodipine besylate) .Marland Kitchen... Take 1 tab daily    Plavix 75 Mg Tabs (Clopidogrel bisulfate) .Marland Kitchen... Take 1 tab daily    Lopressor 50 Mg Tabs (Metoprolol tartrate) .Marland Kitchen... Take 1 tab daily    Ecotrin 325 Mg Tbec (Aspirin) .Marland Kitchen... Take 1 tab daily  Problem # 3:  HYPERTENSION (ICD-401.9) Assessment: Improved Consider Coreg vs Lopressor if BP remains above on next visit. His updated medication list for this problem includes:    Diovan 80 Mg Tabs (Valsartan) .Marland Kitchen... Take 1 tablet by mouth once daily    Amlodipine Besylate 10 Mg Tabs (Amlodipine besylate) .Marland Kitchen... Take 1 tab daily    Lopressor 50 Mg Tabs (Metoprolol tartrate) .Marland Kitchen... Take 1 tab daily    Furosemide 40 Mg Tabs (Furosemide) .Marland Kitchen... Take 1 tablet by mouth once daily    Ecotrin 325 Mg Tbec (Aspirin) .Marland Kitchen... Take 1 tab daily  Other Orders: T-Basic Metabolic Panel [phone]) T-BNP  (B Natriuretic Peptide) [phone])  Patient Instructions: 1)  Your physician recommends that you schedule a follow-up appointment in: 1 month 2)  Your physician recommends that you return for lab work in: today

## 2010-05-01 NOTE — Progress Notes (Signed)
Summary: diovan rx refill  Phone Note Call from Patient Call back at Home Phone [phone]   Caller: pt Reason for Call: Refill Medication Summary of Call: pt needs diavan needs called in to lanes [phone]. Initial call taken by: [name],  January 10, 2010 1:11 PM    Prescriptions: DIOVAN 80 MG TABS (VALSARTAN) take 1 tablet by mouth once daily  #30 x 3   Entered by:   Larita Fife Via LPN   Authorized by:   Gaylord Shih, MD, [facility]   Signed by:   Larita Fife Via LPN on 55/73/2202   Method used:   Electronically to        [facility]* (retail)       [address]       [place], [place]  [address]       Ph: [phone]       Fax: [phone]   RxID:   [id number]
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
## 2010-05-01 NOTE — Letter (Signed)
Summary: [place]  [address] [place] [address]   Phone: [phone]  Fax: [phone]      January 25, 2010 MRN: [id number]   Johnny [name] [address] [place], [place]  [address]   Dear Mr. [facility],  Your test ordered by Selena Batten has been reviewed by your physician (or physician assistant) and was found to be normal or stable. Your physician (or physician assistant) felt no changes were needed at this time.  ____ Echocardiogram  ____ Cardiac Stress Test  __X__ Lab Work  ____ Peripheral vascular study of arms, legs or neck  ____ CT scan or X-ray  ____ Lung or Breathing test  ____ Other:  Please continue on current mediacl treatment. Thank you.   Valera Castle, MD, F.A.C.C

## 2010-05-01 NOTE — Letter (Signed)
Summary: MRI MR KNEE 09-01-09  MRI MR KNEE 09-01-09   Imported By: [name] 10/06/2009 16:41:46  _____________________________________________________________________  External Attachment:    Type:   Image     Comment:   External Document

## 2010-05-01 NOTE — Letter (Signed)
Summary: SURGICAL CLEARANCE NEEDED  SURGICAL CLEARANCE NEEDED   Imported By: [name] 09/11/2009 10:44:28  _____________________________________________________________________  External Attachment:    Type:   Image     Comment:   External Document

## 2010-05-01 NOTE — Letter (Signed)
Summary: PR OP CLEARANCE  PR OP CLEARANCE   Imported By: [name] 10/06/2009 16:41:20  _____________________________________________________________________  External Attachment:    Type:   Image     Comment:   External Document

## 2010-05-01 NOTE — Assessment & Plan Note (Signed)
Summary: F3M   Visit Type:  Follow-up Primary Provider:  DR.EDWARD HAWKINS  CC:  NO CARDIOLOGY COMPLAINTS.  History of Present Illness: Johnny [name] comes in today for preoperative clearance for knee surgery. This is scheduled in about a week or so in [place] with Dr. Terrilee Croak.  He is having no angina, orthopnea, palpitations. He is not weighing himself at home. He notes increased lower extremity edema.  He  is on Actos and his family is worried that this has been written up his increasing heart attacks recently.he says his blood sugars been running about 140.  Also note he is on simvastatin and amlodipine    Current Medications (verified): 1)  Diovan 80 Mg Tabs (Valsartan) .... Take 1 Tablet By Mouth Once Daily 2)  Alprazolam 0.5 Mg Tabs (Alprazolam) .... Take 1` Tab Three Times A Day 3)  Percocet 10-325 Mg Tabs (Oxycodone-Acetaminophen) .... Take Asneeded 4)  Amlodipine Besylate 10 Mg Tabs (Amlodipine Besylate) .... Take 1 Tab Daily 5)  Morphine Sulfate 30 Mg Tabs (Morphine Sulfate) .... Take 1 Tab Three Times A Day 6)  Plavix 75 Mg Tabs (Clopidogrel Bisulfate) .... Take 1 Tab Daily 7)  Lopressor 50 Mg Tabs (Metoprolol Tartrate) .... Take 1 Tab Daily 8)  Metformin Hcl 1000 Mg Tabs (Metformin Hcl) .... Take 1 Tablet By Mouth Two Times A Day 9)  Furosemide 80 Mg Tabs (Furosemide) .... Take 1 Tablet By Mouth Every Morning 10)  Ecotrin 325 Mg Tbec (Aspirin) .... Take 1 Tab Daily 11)  Klor-Con M10 10 Meq Cr-Tabs (Potassium Chloride Crys Cr) .... Take 2 Tablets By Mouth Two Times A Day 12)  Fish Oil 1000 Mg Caps (Omega-3 Fatty Acids) .... Take 1 Tab Daily 13)  Seroquel 50 Mg Tabs (Quetiapine Fumarate) .... Take 1 Tab Daily 14)  Ibuprofen 200 Mg Tabs (Ibuprofen) .... Take As Needed 15)  Nitrostat 0.4 Mg Subl (Nitroglycerin) .... Take As Directed For Chest Pain 16)  Lipitor 20 Mg Tabs (Atorvastatin Calcium) .... 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  Alcohol Use - no Drug Use - no  Risk Factors: Alcohol Use: 0 (04/13/2009)  Risk Factors: Smoking Status: quit > 6 months (04/13/2009)  Review of Systems       negative history of present illness  Vital Signs:  Patient profile:   70 year old male Weight:      263 pounds Pulse rate:   55 / minute BP sitting:   151 / 78  (right arm)  Vitals Entered By: Dreama Saa, CNA (October 06, 2009 10:45 AM)  Physical Exam  General:  obese.  obese.   Head:  normocephalic and atraumatic Eyes:  PERRLA/EOM intact; conjunctiva and lids normal. Neck:  Neck supple, no JVD. No masses, thyromegaly or abnormal cervical nodes. Lungs:  Clear bilaterally to auscultation and percussion. Heart:  Pdifficult to appreciate, regular rate and rhythm, no gallop Abdomen:  obesity precludes adequate assessment Msk:  decreased ROM.  decreased ROM.   Pulses:  pulses normal in all 4 extremities Extremities:  2+ left pedal  edema and 2+ right pedal edema.  2+ left pedal edema and 2+ right pedal edema.   Neurologic:  Alert and oriented x 3. Skin:  Intact without lesions or rashes. Psych:  Normal affect.   Problems:  Medical Problems Added: 1)  Dx of Hyperlipidemia-mixed  (ICD-272.4) 2)  Dx of Hyperlipidemia-mixed  (ICD-272.4)  Impression & Recommendations:  Problem # 1:  CORONARY ATHEROSCLEROSIS, NATIVE VESSEL (ICD-414.01) Assessment  Unchanged  His updated medication list for this problem includes:    Amlodipine Besylate 10 Mg Tabs (Amlodipine besylate) .Marland Kitchen... Take 1 tab daily    Plavix 75 Mg Tabs (Clopidogrel bisulfate) .Marland Kitchen... Take 1 tab daily    Lopressor 50 Mg Tabs (Metoprolol tartrate) .Marland Kitchen... Take 1 tab daily    Ecotrin 325 Mg Tbec (Aspirin) .Marland Kitchen... Take 1 tab daily    Nitrostat 0.4 Mg Subl (Nitroglycerin) .Marland Kitchen... Take as directed for chest pain  Problem # 2:  EDEMA (ICD-782.3) Assessment: Deteriorated I have asked him to weigh each day. He has gained about 6 pounds since April. I will discontinue his Actos and increase his metformin 2000 mg twice a day. I've increased his Lasix to 80 mg each morning.  Problem # 3:  DIASTOLIC HEART FAILURE, CHRONIC (ICD-428.32) Assessment: Deteriorated  His updated medication list for this problem includes:    Diovan 80 Mg Tabs (Valsartan) .Marland Kitchen... Take 1 tablet by mouth once daily    Amlodipine Besylate 10 Mg Tabs (Amlodipine besylate) .Marland Kitchen... Take 1 tab daily    Plavix 75 Mg Tabs (Clopidogrel bisulfate) .Marland Kitchen... Take 1 tab daily    Lopressor 50 Mg Tabs (Metoprolol tartrate) .Marland Kitchen... Take 1 tab daily    Furosemide 80 Mg Tabs (Furosemide) .Marland Kitchen... Take 1 tablet by mouth every morning    Ecotrin 325 Mg Tbec (Aspirin) .Marland Kitchen... Take 1 tab daily    Nitrostat 0.4 Mg Subl (Nitroglycerin) .Marland Kitchen... Take as directed for chest pain  Problem # 4:  CARDIOMYOPATHY, SECONDARY (ICD-425.9)  His updated medication list for this problem includes:    Diovan 80 Mg Tabs (Valsartan) .Marland Kitchen... Take 1 tablet by mouth once daily    Amlodipine Besylate 10 Mg Tabs (Amlodipine besylate) .Marland Kitchen... Take 1 tab daily    Plavix 75 Mg Tabs (Clopidogrel bisulfate) .Marland Kitchen... Take 1 tab daily    Lopressor 50 Mg Tabs (Metoprolol tartrate) .Marland Kitchen... Take 1 tab daily    Furosemide 80 Mg Tabs (Furosemide) .Marland Kitchen... Take 1 tablet by mouth every morning    Ecotrin 325 Mg Tbec (Aspirin) .Marland Kitchen... Take 1 tab daily    Nitrostat 0.4 Mg Subl (Nitroglycerin) .Marland Kitchen... Take as  directed for chest pain  Problem # 5:  HYPERTENSION (ICD-401.9) Assessment: Unchanged  His updated medication list for this problem includes:    Diovan 80 Mg Tabs (Valsartan) .Marland Kitchen... Take 1 tablet by mouth once daily    Amlodipine Besylate 10 Mg Tabs (Amlodipine besylate) .Marland Kitchen... Take 1 tab daily    Lopressor 50 Mg Tabs (Metoprolol tartrate) .Marland Kitchen... Take 1 tab daily    Furosemide 80 Mg Tabs (Furosemide) .Marland Kitchen... Take 1 tablet by mouth every morning    Ecotrin 325 Mg Tbec (Aspirin) .Marland Kitchen... Take 1 tab daily  Problem # 6:  MI (ICD-410.90) Assessment: Unchanged  His updated medication list for this problem includes:    Amlodipine Besylate 10 Mg Tabs (Amlodipine besylate) .Marland Kitchen... Take 1 tab daily    Plavix 75 Mg Tabs (Clopidogrel bisulfate) .Marland Kitchen... Take 1 tab daily    Lopressor 50 Mg Tabs (Metoprolol tartrate) .Marland Kitchen... Take 1 tab daily  Ecotrin 325 Mg Tbec (Aspirin) .Marland Kitchen... Take 1 tab daily    Nitrostat 0.4 Mg Subl (Nitroglycerin) .Marland Kitchen... Take as directed for chest pain  Problem # 7:  UNSPECIFIED SLEEP APNEA (ICD-780.57)  Problem # 8:  HYPERLIPIDEMIA-MIXED (ICD-272.4)  I will change him to Lipitor 40 mg a day with followup blood work in 6 weeks. The following medications were removed from the medication list:    Simvastatin 40 Mg Tabs (Simvastatin) .Marland Kitchen... Take 1 tab daily His updated medication list for this problem includes:    Lipitor 20 Mg Tabs (Atorvastatin calcium) .Marland Kitchen... Take 1 tablet by mouth at bedtime  His updated medication list for this problem includes:    Simvastatin 40 Mg Tabs (Simvastatin) .Marland Kitchen... Take 1 tab daily  Problem # 9:  DM (ICD-250.00)  The following medications were removed from the medication list:    Actos 30 Mg Tabs (Pioglitazone hcl) .Marland Kitchen... Take 1 tab daily His updated medication list for this problem includes:    Diovan 80 Mg Tabs (Valsartan) .Marland Kitchen... Take 1 tablet by mouth once daily    Metformin Hcl 1000 Mg Tabs (Metformin hcl) .Marland Kitchen... Take 1 tablet by mouth two times a day     Ecotrin 325 Mg Tbec (Aspirin) .Marland Kitchen... Take 1 tab daily  The following medications were removed from the medication list:    Actos 30 Mg Tabs (Pioglitazone hcl) .Marland Kitchen... Take 1 tab daily His updated medication list for this problem includes:    Diovan 80 Mg Tabs (Valsartan) .Marland Kitchen... Take 1 tablet by mouth once daily    Metformin Hcl 1000 Mg Tabs (Metformin hcl) .Marland Kitchen... Take 1 tablet by mouth two times a day    Ecotrin 325 Mg Tbec (Aspirin) .Marland Kitchen... Take 1 tab daily  Patient Instructions: 1)  Your physician recommends that you schedule a follow-up appointment in: 3 months 2)  Your physician recommends that you return for lab work in: 6 weeks 3)  Your physician has recommended you make the following change in your medication: Increase Furosemide to 80mg  by mouth every morning, increase Metformin to 1000mg  by mouth two times a day, stop taking Simvastatin and start taking Lipitor 20mg  po qhs and stop taking Actos. 4)  ***Please stop taking Plavix and Aspirin 1 week before surgery*** Prescriptions: LIPITOR 20 MG TABS (ATORVASTATIN CALCIUM) take 1 tablet by mouth at bedtime  #30 x 6   Entered by:   Larita Fife Via LPN   Authorized by:   Gaylord Shih, MD, [facility]   Signed by:   Larita Fife Via LPN on 16/12/9602   Method used:   Electronically to        [facility]* (retail)       [address]       [place], [place]  [address]       Ph: [phone]       Fax: [phone]   RxID:   [id number] METFORMIN HCL 1000 MG TABS (METFORMIN HCL) take 1 tablet by mouth two times a day  #60 x 6   Entered by:   Larita Fife Via LPN   Authorized by:   Gaylord Shih, MD, [facility]   Signed by:   Larita Fife Via LPN on 01/26/2535   Method used:   Electronically to        [facility]* (retail)       [address]       [place]  [place], [place]  [address]       Ph: [phone]       Fax: [phone]   RxID:   [id number] FUROSEMIDE 80 MG TABS (FUROSEMIDE) take 1 tablet by mouth every  morning  #30 x 6   Entered by:   Larita Fife Via LPN   Authorized by:   Gaylord Shih, MD, [facility]   Signed by:   Larita Fife Via LPN on 73/71/0626   Method used:   Electronically to        [facility]* (retail)       [address]       [place], [place]  [address]       Ph: [phone]       Fax: [phone]   RxID:   [id number]

## 2010-05-01 NOTE — Progress Notes (Signed)
Summary: nose bleeds on plavis and asprin  Phone Note Call from Patient Call back at Home Phone [phone]   Caller: pt Reason for Call: Talk to Nurse Summary of Call: pt has been having nose bleeds on and off, he is on plavix and asprin per pt wife. He has been getting choked on the blood in his throat. Initial call taken by: [name],  November 15, 2009 12:19 PM  Follow-up for Phone Call        Cut ASA to 81mg /d Follow-up by: Gaylord Shih, MD, [facility],  November 15, 2009 3:21 PM

## 2010-05-01 NOTE — Progress Notes (Signed)
**Note De-Identified Arienna Benegas Obfuscation** Summary: Medication Questions  Phone Note Call from Patient   Caller: Spouse Reason for Call: Talk to Nurse Summary of Call: pt's wife has concerns about new BP medication that was added at patient's office visit yesterday / the medicine is Diovan / tg Initial call taken by: Raechel Ache [facility],  January 24, 2010 8:52 AM  Follow-up for Phone Call        Wife wanted to know if pt. can take Metoprolol dose at night since he takes Diovan and Amlodipine in the am. Wife advised that if pt. has no problems taking at night, he may continue. Follow-up by: Larita Fife Indie Nickerson LPN,  January 24, 2010 10:01 AM

## 2010-05-01 NOTE — Progress Notes (Signed)
Summary: Question  Phone Note Call from Patient   Caller: Spouse Reason for Call: Talk to Nurse Complaint: Urinary/GYN Problems Summary of Call: S:Pt would like return phone call from nurse/states it is not emergency, just had a question for the nurse/tg B:medical history: edema and chf, last office visit 07/14/2009, requested daily wts A: swelling in legs, hurting, tingling all over legs, legs are cold , no wt gain or sob, no redness in legs  R:elevate legs, low salt diet  Teressa Lower RN  July 25, 2009 11:24 AM   Initial call taken by: Raechel Ache [facility],  July 24, 2009 3:23 PM  Follow-up for Phone Call        agree with conservative measures. If still a problem early next week, see in office. Follow-up by: Gaylord Shih, MD, [facility],  July 28, 2009 11:11 AM       Appended Document: Question I spoke with pt's wife today, he is doing much better, no edema or any complaints at this time

## 2010-05-01 NOTE — Assessment & Plan Note (Signed)
Summary: eph   Visit Type:  Follow-up Primary Provider:  DR.EDWARD HAWKINS   History of Present Illness: Johnny [name] is a 25 obese CM with known history of CAD, recent admission to [facility] for A/C diastolic CHF, and PCI of LAD and right posterior descending artery on 04/03/2009.  This was completed secondary to presumed ischemic component to symoptoms of DOE and CP.  Cath showed multivessel disease with significant stenosis in the PDA as well as a small PLA.  LAD had 60% stenosis and review of films with doppler flow wire showed a FFR  of 0.74.  It was determined that he would benefit from two Xience DES to the mid LAD.  A Xience DES was also placed to to the PDA.   He was also adequately diuresed with IV Lasix and sent home on Lasix 20mg . His weight was 117 kg (257.4 lbs) on discharge. His weight is up 5lbs since discharge-per our scale at 262.  Since discharge the patient has had exquisite pain distal to the R groin site, with extensive bruising. He called our office and was advised to go to [facility] ER to have this evaluated.  The ER physician felt that it was ecchymosis only.  No change in H/H to reveal bleeding.  He was boarderline hypokalemic with a K of 3.6.  On evaluation today, he states that the pain is much better. There is still considerable bruising, but it is old and is dissapating.  He continues complaints of chronic DOE and still sleeps in his recliner at night to help with breathing and comfort of chronic back pain.  He is on multiple narcotics for chronic pain.  He admitts to sleep apnea but does not tolerate CPAP.  He is often sleepy during the day, but it may be a combination fo narcotics as well as sleep apnea.  Other than his chronic complaints, he is stable.  Preventive Screening-Counseling & Management  Alcohol-Tobacco     Alcohol drinks/day: 0     Smoking Status: quit > 6 months  Current Problems (verified): 1)  Dm  (ICD-250.00) 2)  Hypertension  (ICD-401.9) 3)  Osteoarthritis,  Generalized, Multiple Joints  (ICD-715.09) 4)  Barretts Esophagus  (ICD-530.85) 5)  Mi  (ICD-410.90) 6)  Unspecified Hypothyroidism  (ICD-244.9) 7)  Unspecified Sleep Apnea  (ICD-780.57)  Current Medications (verified): 1)  Micardis 80 Mg Tabs (Telmisartan) .... Take One Tablet By Mouth Daily 2)  Alprazolam 0.5 Mg Tabs (Alprazolam) .... Take 1` Tab Three Times A Day 3)  Percocet 10-325 Mg Tabs (Oxycodone-Acetaminophen) .... Take Asneeded 4)  Amlodipine Besylate 10 Mg Tabs (Amlodipine Besylate) .... Take 1 Tab Daily 5)  Morphine Sulfate 30 Mg Tabs (Morphine Sulfate) .... Take 1 Tab Two Times A Day 6)  Plavix 75 Mg Tabs (Clopidogrel Bisulfate) .... Take 1 Tab Daily 7)  Lopressor 50 Mg Tabs (Metoprolol Tartrate) .... Take 1 Tab Daily 8)  Metformin Hcl 500 Mg Tabs (Metformin Hcl) .... Take 1 Tab Daily 9)  Furosemide 20 Mg Tabs (Furosemide) .... Take 1 Tab Daily 10)  Ecotrin 325 Mg Tbec (Aspirin) .... Take 1 Tab Daily 11)  Klor-Con M10 10 Meq Cr-Tabs (Potassium Chloride Crys Cr) .... Take 1 Tab Two Times A Day 12)  Actos 30 Mg Tabs (Pioglitazone Hcl) .... Take 1 Tab Daily 13)  Simvastatin 40 Mg Tabs (Simvastatin) .... Take 1 Tab Daily 14)  Fish Oil 1000 Mg Caps (Omega-3 Fatty Acids) .... Take 1 Tab Daily  Allergies (verified): No Known Drug Allergies  Past History:  Past medical, surgical, family and social histories (including risk factors) reviewed, and no changes noted (except as noted below).  Past Medical History: Reviewed history from 08/30/2008 and no changes required. 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 Nissen fundoplication bilateral carpel tunnel release Back Surgery  Family  History: Reviewed history from 08/30/2008 and no changes required. pt is adopted   Social History: Reviewed history from 08/30/2008 and no changes required. Patient has never smoked.  Alcohol Use - no Drug Use - no Smoking Status:  quit > 6 months Alcohol drinks/day:  0  Review of Systems       The patient complains of dyspnea on exertion.         R. leg ecchymosis with pain.  Chronic back pain.  All other systems have been reviewed and are negative unless stated above.   Vital Signs:  Patient profile:   70 year old male Height:      71 inches Weight:      262 pounds BMI:     36.67 Pulse rate:   81 / minute BP sitting:   178 / 87  (right arm)  Vitals Entered By: Dreama Saa, CNA (April 13, 2009 1:54 PM)  Physical Exam  General:  Well developed, well nourished, in no acute distress. Obese Head:  normocephalic and atraumatic Eyes:  PERRLA/EOM intact; conjunctiva and lids normal. Neck:  Obese Lungs:  Clear bilaterally to auscultation and percussion. Heart:  Distant HS, 1/6 systolic murmur Abdomen:  Very obese, non distended with normal bowel sounds. Msk:  Back pain is prominent with any movement.  Extremities:  Examination of the R leg shows no hematoma, old bruising which is dissapating toward the medial side of the R knee is noted.  It is soft.  No bruit is appreciated. Neurologic:  Alert and oriented x 3. Psych:  Normal affect.   Impression & Recommendations:  Problem # 1:  DIASTOLIC HEART FAILURE, CHRONIC (ICD-428.32) Johnny [name] is up approximately 5 lbs since discharge without increased symptoms.  He has no over evidence of CHF at this time.  He will continue on current dose of Lasix./Potassium.  Weigh himself daily and if he continues to gain weight without eating more, we will need to increase his Lasix dose to 40mg  daily.   His updated medication list for this problem includes:    Micardis 80 Mg Tabs (Telmisartan) .Marland Kitchen... Take one tablet by mouth daily     Amlodipine Besylate 10 Mg Tabs (Amlodipine besylate) .Marland Kitchen... Take 1 tab daily    Plavix 75 Mg Tabs (Clopidogrel bisulfate) .Marland Kitchen... Take 1 tab daily    Lopressor 50 Mg Tabs (Metoprolol tartrate) .Marland Kitchen... Take 1 tab daily    Furosemide 20 Mg Tabs (Furosemide) .Marland Kitchen... Take 1 tab daily    Ecotrin 325 Mg Tbec (Aspirin) .Marland Kitchen... Take 1 tab daily  Problem # 2:  CARDIOMYOPATHY, SECONDARY (ICD-425.9) Diastolic dysfuction is noted without ischemic component.  Continue BB.  and Amlodipine. His updated medication list for this problem includes:    Micardis 80 Mg Tabs (Telmisartan) .Marland Kitchen... Take one tablet by mouth daily    Amlodipine Besylate 10 Mg Tabs (Amlodipine besylate) .Marland Kitchen... Take 1 tab daily    Plavix 75 Mg Tabs (Clopidogrel bisulfate) .Marland Kitchen... Take 1 tab daily    Lopressor 50 Mg Tabs (Metoprolol  tartrate) .Marland Kitchen... Take 1 tab daily    Furosemide 20 Mg Tabs (Furosemide) .Marland Kitchen... Take 1 tab daily    Ecotrin 325 Mg Tbec (Aspirin) .Marland Kitchen... Take 1 tab daily  Problem # 3:  HYPERTENSION (ICD-401.9) His BP is not optimal for patient with Diabetes and CAD.  He is not taking his Micardis, and is intolerant to Benicar because of cough per patient.  I have given him samples of Diovan 80mg  to take daily.  We will see him in one month to evaluate BP.  Needs BMET before being seen. His updated medication list for this problem includes:    Micardis 80 Mg Tabs (Telmisartan) .Marland Kitchen... Take one tablet by mouth daily    Amlodipine Besylate 10 Mg Tabs (Amlodipine besylate) .Marland Kitchen... Take 1 tab daily    Lopressor 50 Mg Tabs (Metoprolol tartrate) .Marland Kitchen... Take 1 tab daily    Furosemide 20 Mg Tabs (Furosemide) .Marland Kitchen... Take 1 tab daily    Ecotrin 325 Mg Tbec (Aspirin) .Marland Kitchen... Take 1 tab daily  Problem # 4:  CORONARY ATHEROSCLEROSIS, NATIVE VESSEL (ICD-414.01) He is asymptomatic at this time s/p DES of PDA and two to the LAD.  Continue Plavix/ASA for at least one year. His updated medication list for this problem includes:    Amlodipine Besylate 10 Mg Tabs  (Amlodipine besylate) .Marland Kitchen... Take 1 tab daily    Plavix 75 Mg Tabs (Clopidogrel bisulfate) .Marland Kitchen... Take 1 tab daily    Lopressor 50 Mg Tabs (Metoprolol tartrate) .Marland Kitchen... Take 1 tab daily    Ecotrin 325 Mg Tbec (Aspirin) .Marland Kitchen... Take 1 tab daily  Patient Instructions: 1)  Your physician recommends that you schedule a follow-up appointment in: 1 month 2)  Your physician recommends that you continue on your current medications as directed. Please refer to the Current Medication list given to you today.

## 2010-05-01 NOTE — Letter (Signed)
Summary: Endo/Colon Letter  [facility]  [address] [place], [place] [address]   Phone: [phone]  Fax: [phone]      Aug 17, 2009 MRN: [id number]   Johnny [name] [address] [place], [place]  [address]   Dear Mr. [facility],   According to your medical record, it is time for you to schedule an Endoscopy/Colonoscopy . Endoscopic screeening is recommended for patients with certain upper digestive tract conditions because of associated increased risk for cancers of the upper digestive system. The American Cancer Society recommends Colonoscopy as a method to detect early colon cancer. Patients with a family history of colon cancer, or a personal history of colon polyps or inflammatory bowel disease are at increased risk.  This letter has been generated based on the recommendations made at the time of your prior procedure. If you feel that in your particular situation this may no longer apply, please contact our office.  Please call our office at [phone] to schedule this appointment or to update your records at your earliest convenience.  Thank you for cooperating with [place] to provide you with the very best care possible.   Sincerely,  Wilhemina Bonito. Marina Goodell, M.D.   [facility] [phone]

## 2010-05-01 NOTE — Progress Notes (Signed)
Summary: BP check  Phone Note Outgoing Call   Call placed by: Larita Fife Via LPN,  January 30, 2010 4:29 PM Summary of Call: Pt. called this morning to confirm todays BP check with nurse and was told it was scheduled for 4:00pm today. Pt. and his wife came to office at 2:30, which was during clinic. Pt. and wife complained that they had to wait and left at 3:20 without being seen by nurse.  Initial call taken by: Larita Fife Via LPN,  January 30, 2010 4:35 PM  Follow-up for Phone Call        please have Molly Maduro call and explain reason for not being seen. thxs Follow-up by: Gaylord Shih, MD, [facility],  February 01, 2010 10:03 AM     Appended Document: BP check I called and talked with Chrissie Noa. His concern was that he explained to the staff that he was having a family emergency and needed to get in and out in a quickly.  After sitting in the waiting room for an hour he decided he needed to leave to take care of the family issue.  I will review with the staff if a patient presents with such an issue we should responsd by talking with the nurse to get them in and out.  Or by explaining the schedule of the day and offer the patient a new appointment.  I gave Makena my name so if in the future he has any other issues within the clinic he can ask or call me directly.

## 2010-05-01 NOTE — Miscellaneous (Signed)
Summary: labs cmp,lipids,08/25/2008  Clinical Lists Changes  Observations: Added new observation of CALCIUM: 9.3 mg/dL (81/19/1478 29:56) Added new observation of ALBUMIN: 4.4 g/dL (21/30/8657 84:69) Added new observation of PROTEIN, TOT: 7.1 g/dL (62/95/2841 32:44) Added new observation of SGPT (ALT): 16 units/L (08/25/2008 10:53) Added new observation of SGOT (AST): 15 units/L (08/25/2008 10:53) Added new observation of ALK PHOS: 59 units/L (08/25/2008 10:53) Added new observation of CREATININE: 0.81 mg/dL (04/03/7251 66:44) Added new observation of BUN: 20 mg/dL (03/47/4259 56:38) Added new observation of BG RANDOM: 162 mg/dL (75/64/3329 51:88) Added new observation of CO2 PLSM/SER: 28 meq/L (08/25/2008 10:53) Added new observation of CL SERUM: 102 meq/L (08/25/2008 10:53) Added new observation of K SERUM: 3.7 meq/L (08/25/2008 10:53) Added new observation of NA: 141 meq/L (08/25/2008 10:53) Added new observation of LDL: 47 mg/dL (41/66/0630 16:01) Added new observation of HDL: 29 mg/dL (09/32/3557 32:20) Added new observation of TRIGLYC TOT: 90 mg/dL (25/42/7062 37:62) Added new observation of CHOLESTEROL: 94 mg/dL (83/15/1761 60:73)

## 2010-05-03 NOTE — Miscellaneous (Signed)
  Clinical Lists Changes  Medications: Changed medication from LOPRESSOR 50 MG TABS (METOPROLOL TARTRATE) TAKE 1 TAB DAILY to METOPROLOL SUCCINATE 50 MG XR24H-TAB (METOPROLOL SUCCINATE) 1 tab once daily

## 2010-06-12 NOTE — Medication Information (Signed)
Summary: [facility] Atorvastatin Prescription Notification   [facility] Atorvastatin Prescription Notification   Imported By: Roderic Ovens 06/07/2010 15:28:06  _____________________________________________________________________  External Attachment:    Type:   Image     Comment:   External Document

## 2010-06-17 LAB — CBC
HCT: 31.6 % — ABNORMAL LOW (ref 39.0–52.0)
HCT: 33.6 % — ABNORMAL LOW (ref 39.0–52.0)
MCHC: 33.6 g/dL (ref 30.0–36.0)
MCHC: 33.8 g/dL (ref 30.0–36.0)
MCHC: 32.8 g/dL (ref 30.0–36.0)
MCV: 85.2 fL (ref 78.0–100.0)
MCV: 85.9 fL (ref 78.0–100.0)
MCV: 85.3 fL (ref 78.0–100.0)
Platelets: 183 10*3/uL (ref 150–400)
Platelets: 200 10*3/uL (ref 150–400)
Platelets: 215 10*3/uL (ref 150–400)
Platelets: 243 10*3/uL (ref 150–400)
RBC: 3.71 MIL/uL — ABNORMAL LOW (ref 4.22–5.81)
RBC: 3.86 MIL/uL — ABNORMAL LOW (ref 4.22–5.81)
RBC: 3.91 MIL/uL — ABNORMAL LOW (ref 4.22–5.81)
RBC: 4.2 MIL/uL — ABNORMAL LOW (ref 4.22–5.81)
RDW: 14.3 % (ref 11.5–15.5)
RDW: 14.3 % (ref 11.5–15.5)
WBC: 8.4 10*3/uL (ref 4.0–10.5)
WBC: 8.7 10*3/uL (ref 4.0–10.5)

## 2010-06-17 LAB — DIFFERENTIAL
Basophils Relative: 1 % (ref 0–1)
Lymphocytes Relative: 20 % (ref 12–46)
Monocytes Relative: 9 % (ref 3–12)
Neutro Abs: 5.9 10*3/uL (ref 1.7–7.7)
Neutrophils Relative %: 65 % (ref 43–77)

## 2010-06-17 LAB — BASIC METABOLIC PANEL
BUN: 14 mg/dL (ref 6–23)
BUN: 14 mg/dL (ref 6–23)
BUN: 16 mg/dL (ref 6–23)
CO2: 27 mEq/L (ref 19–32)
CO2: 29 mEq/L (ref 19–32)
Calcium: 9.3 mg/dL (ref 8.4–10.5)
Calcium: 9.4 mg/dL (ref 8.4–10.5)
Chloride: 105 mEq/L (ref 96–112)
Chloride: 107 mEq/L (ref 96–112)
Creatinine, Ser: 0.69 mg/dL (ref 0.4–1.5)
Creatinine, Ser: 0.77 mg/dL (ref 0.4–1.5)
Creatinine, Ser: 0.87 mg/dL (ref 0.4–1.5)
GFR calc Af Amer: >60 mL/min (ref >60)
GFR calc Af Amer: >60 mL/min (ref >60)
GFR calc non Af Amer: >60 mL/min (ref >60)
Potassium: 3.7 mEq/L (ref 3.5–5.1)

## 2010-06-17 LAB — GLUCOSE, CAPILLARY
Glucose-Capillary: 102 mg/dL — ABNORMAL HIGH (ref 70–99)
Glucose-Capillary: 116 mg/dL — ABNORMAL HIGH (ref 70–99)
Glucose-Capillary: 117 mg/dL — ABNORMAL HIGH (ref 70–99)
Glucose-Capillary: 117 mg/dL — ABNORMAL HIGH (ref 70–99)
Glucose-Capillary: 121 mg/dL — ABNORMAL HIGH (ref 70–99)
Glucose-Capillary: 96 mg/dL (ref 70–99)
Glucose-Capillary: 99 mg/dL (ref 70–99)

## 2010-06-17 LAB — HEPARIN LEVEL (UNFRACTIONATED)
Heparin Unfractionated: 0.21 IU/mL — ABNORMAL LOW (ref 0.30–0.70)
Heparin Unfractionated: 0.41 IU/mL (ref 0.30–0.70)

## 2010-06-17 LAB — APTT: aPTT: 29 seconds (ref 24–37)

## 2010-06-17 LAB — PROTIME-INR: INR: 1.13 (ref 0.00–1.49)

## 2010-07-02 LAB — HEPARIN LEVEL (UNFRACTIONATED)
Heparin Unfractionated: 0.11 IU/mL — ABNORMAL LOW (ref 0.30–0.70)
Heparin Unfractionated: 0.27 IU/mL — ABNORMAL LOW (ref 0.30–0.70)
Heparin Unfractionated: 0.28 IU/mL — ABNORMAL LOW (ref 0.30–0.70)

## 2010-07-02 LAB — CARDIAC PANEL(CRET KIN+CKTOT+MB+TROPI)
CK, MB: 1.9 ng/mL (ref 0.3–4.0)
CK, MB: 2.4 ng/mL (ref 0.3–4.0)
Relative Index: INVALID (ref 0.0–2.5)
Troponin I: 0.02 ng/mL (ref 0.00–0.06)

## 2010-07-02 LAB — LIPID PANEL
Total CHOL/HDL Ratio: 4.5 RATIO
VLDL: 20 mg/dL (ref 0–40)

## 2010-07-02 LAB — DIFFERENTIAL
Eosinophils Absolute: 0.4 10*3/uL (ref 0.0–0.7)
Lymphocytes Relative: 16 % (ref 12–46)
Lymphs Abs: 2.2 10*3/uL (ref 0.7–4.0)
Neutro Abs: 9.7 10*3/uL — ABNORMAL HIGH (ref 1.7–7.7)
Neutrophils Relative %: 72 % (ref 43–77)

## 2010-07-02 LAB — COMPREHENSIVE METABOLIC PANEL
ALT: 16 U/L (ref 0–53)
Albumin: 3.6 g/dL (ref 3.5–5.2)
Alkaline Phosphatase: 49 U/L (ref 39–117)
Chloride: 104 mEq/L (ref 96–112)
Potassium: 3.2 mEq/L — ABNORMAL LOW (ref 3.5–5.1)
Sodium: 140 mEq/L (ref 135–145)
Total Protein: 6.1 g/dL (ref 6.0–8.3)

## 2010-07-02 LAB — BRAIN NATRIURETIC PEPTIDE: Pro B Natriuretic peptide (BNP): 161 pg/mL — ABNORMAL HIGH (ref 0.0–100.0)

## 2010-07-02 LAB — CBC
Hemoglobin: 11 g/dL — ABNORMAL LOW (ref 13.0–17.0)
MCHC: 33.6 g/dL (ref 30.0–36.0)
MCHC: 33.7 g/dL (ref 30.0–36.0)
MCV: 85.9 fL (ref 78.0–100.0)
MCV: 85.9 fL (ref 78.0–100.0)
Platelets: 205 10*3/uL (ref 150–400)
Platelets: 213 10*3/uL (ref 150–400)
RBC: 3.79 MIL/uL — ABNORMAL LOW (ref 4.22–5.81)
RDW: 14.1 % (ref 11.5–15.5)
WBC: 13.6 10*3/uL — ABNORMAL HIGH (ref 4.0–10.5)

## 2010-07-02 LAB — GLUCOSE, CAPILLARY
Glucose-Capillary: 113 mg/dL — ABNORMAL HIGH (ref 70–99)
Glucose-Capillary: 113 mg/dL — ABNORMAL HIGH (ref 70–99)
Glucose-Capillary: 122 mg/dL — ABNORMAL HIGH (ref 70–99)
Glucose-Capillary: 136 mg/dL — ABNORMAL HIGH (ref 70–99)
Glucose-Capillary: 140 mg/dL — ABNORMAL HIGH (ref 70–99)

## 2010-07-02 LAB — POCT CARDIAC MARKERS
CKMB, poc: 1.9 ng/mL (ref 1.0–8.0)
Myoglobin, poc: 71.8 ng/mL (ref 12–200)

## 2010-07-02 LAB — BASIC METABOLIC PANEL
BUN: 10 mg/dL (ref 6–23)
Calcium: 9.3 mg/dL (ref 8.4–10.5)
Creatinine, Ser: 0.68 mg/dL (ref 0.4–1.5)
GFR calc Af Amer: >60 mL/min (ref >60)
GFR calc non Af Amer: >60 mL/min (ref >60)

## 2010-07-02 LAB — APTT: aPTT: 28 seconds (ref 24–37)

## 2010-07-02 LAB — HEMOGLOBIN A1C: Hgb A1c MFr Bld: 7.3 % — ABNORMAL HIGH (ref 4.6–6.1)

## 2010-07-02 LAB — PROTIME-INR
INR: 1.08 (ref 0.00–1.49)
Prothrombin Time: 13.9 seconds (ref 11.6–15.2)

## 2010-07-04 LAB — CBC
Hemoglobin: 12.7 g/dL — ABNORMAL LOW (ref 13.0–17.0)
Hemoglobin: 12.9 g/dL — ABNORMAL LOW (ref 13.0–17.0)
MCHC: 33 g/dL (ref 30.0–36.0)
MCV: 87 fL (ref 78.0–100.0)
RBC: 4.4 MIL/uL (ref 4.22–5.81)
RBC: 4.41 MIL/uL (ref 4.22–5.81)
WBC: 8.4 10*3/uL (ref 4.0–10.5)

## 2010-07-04 LAB — BASIC METABOLIC PANEL
CO2: 23 mEq/L (ref 19–32)
Calcium: 9.1 mg/dL (ref 8.4–10.5)
Chloride: 110 mEq/L (ref 96–112)
Creatinine, Ser: 0.67 mg/dL (ref 0.4–1.5)
GFR calc Af Amer: >60 mL/min (ref >60)
GFR calc Af Amer: >60 mL/min (ref >60)
GFR calc non Af Amer: >60 mL/min (ref >60)
Sodium: 140 mEq/L (ref 135–145)

## 2010-07-04 LAB — GLUCOSE, CAPILLARY
Glucose-Capillary: 107 mg/dL — ABNORMAL HIGH (ref 70–99)
Glucose-Capillary: 185 mg/dL — ABNORMAL HIGH (ref 70–99)

## 2010-07-05 LAB — BASIC METABOLIC PANEL
BUN: 9 mg/dL (ref 6–23)
CO2: 27 mEq/L (ref 19–32)
CO2: 28 mEq/L (ref 19–32)
Calcium: 8.9 mg/dL (ref 8.4–10.5)
Calcium: 9 mg/dL (ref 8.4–10.5)
Chloride: 101 mEq/L (ref 96–112)
Chloride: 102 mEq/L (ref 96–112)
Creatinine, Ser: 0.64 mg/dL (ref 0.4–1.5)
Creatinine, Ser: 0.7 mg/dL (ref 0.4–1.5)
GFR calc Af Amer: >60 mL/min (ref >60)
GFR calc Af Amer: >60 mL/min (ref >60)
GFR calc non Af Amer: >60 mL/min (ref >60)
Glucose, Bld: 131 mg/dL — ABNORMAL HIGH (ref 70–99)
Potassium: 3 mEq/L — ABNORMAL LOW (ref 3.5–5.1)
Sodium: 135 mEq/L (ref 135–145)

## 2010-07-05 LAB — CBC
HCT: 36.5 % — ABNORMAL LOW (ref 39.0–52.0)
HCT: 39.3 % (ref 39.0–52.0)
Hemoglobin: 12.6 g/dL — ABNORMAL LOW (ref 13.0–17.0)
Hemoglobin: 13.4 g/dL (ref 13.0–17.0)
MCHC: 33.7 g/dL (ref 30.0–36.0)
MCHC: 34 g/dL (ref 30.0–36.0)
MCV: 85.9 fL (ref 78.0–100.0)
MCV: 86.4 fL (ref 78.0–100.0)
Platelets: 204 10*3/uL (ref 150–400)
RBC: 4.31 MIL/uL (ref 4.22–5.81)
RBC: 4.58 MIL/uL (ref 4.22–5.81)
RDW: 14.2 % (ref 11.5–15.5)
WBC: 8.6 10*3/uL (ref 4.0–10.5)

## 2010-07-05 LAB — CARDIAC PANEL(CRET KIN+CKTOT+MB+TROPI)
CK, MB: 2.1 ng/mL (ref 0.3–4.0)
Relative Index: 2 (ref 0.0–2.5)
Relative Index: INVALID (ref 0.0–2.5)
Total CK: 115 U/L (ref 7–232)
Troponin I: 0.02 ng/mL (ref 0.00–0.06)

## 2010-07-05 LAB — POCT CARDIAC MARKERS: Myoglobin, poc: 52.3 ng/mL (ref 12–200)

## 2010-07-05 LAB — PROTIME-INR: Prothrombin Time: 14.2 seconds (ref 11.6–15.2)

## 2010-07-05 LAB — GLUCOSE, CAPILLARY
Glucose-Capillary: 135 mg/dL — ABNORMAL HIGH (ref 70–99)
Glucose-Capillary: 146 mg/dL — ABNORMAL HIGH (ref 70–99)
Glucose-Capillary: 158 mg/dL — ABNORMAL HIGH (ref 70–99)
Glucose-Capillary: 159 mg/dL — ABNORMAL HIGH (ref 70–99)
Glucose-Capillary: 167 mg/dL — ABNORMAL HIGH (ref 70–99)
Glucose-Capillary: 183 mg/dL — ABNORMAL HIGH (ref 70–99)

## 2010-07-05 LAB — HEPARIN LEVEL (UNFRACTIONATED)
Heparin Unfractionated: 0.27 IU/mL — ABNORMAL LOW (ref 0.30–0.70)
Heparin Unfractionated: 0.3 IU/mL (ref 0.30–0.70)
Heparin Unfractionated: 0.32 IU/mL (ref 0.30–0.70)

## 2010-07-09 LAB — POCT I-STAT, CHEM 8
Calcium, Ion: 1.12 mmol/L (ref 1.12–1.32)
Chloride: 100 mEq/L (ref 96–112)
HCT: 38 % — ABNORMAL LOW (ref 39.0–52.0)
Sodium: 140 mEq/L (ref 135–145)
TCO2: 26 mmol/L (ref 0–100)

## 2010-07-09 LAB — POCT CARDIAC MARKERS
CKMB, poc: <1 ng/mL — ABNORMAL LOW (ref 1.0–8.0)
Myoglobin, poc: 46.5 ng/mL (ref 12–200)
Troponin i, poc: <0.05 ng/mL (ref 0.00–0.09)
Troponin i, poc: <0.05 ng/mL (ref 0.00–0.09)

## 2010-07-11 LAB — GLUCOSE, CAPILLARY
Glucose-Capillary: 193 mg/dL — ABNORMAL HIGH (ref 70–99)
Glucose-Capillary: 203 mg/dL — ABNORMAL HIGH (ref 70–99)
Glucose-Capillary: 225 mg/dL — ABNORMAL HIGH (ref 70–99)
Glucose-Capillary: 240 mg/dL — ABNORMAL HIGH (ref 70–99)
Glucose-Capillary: 244 mg/dL — ABNORMAL HIGH (ref 70–99)

## 2010-07-11 LAB — BASIC METABOLIC PANEL
BUN: 14 mg/dL (ref 6–23)
BUN: 6 mg/dL (ref 6–23)
CO2: 22 mEq/L (ref 19–32)
Chloride: 102 mEq/L (ref 96–112)
Chloride: 103 mEq/L (ref 96–112)
Creatinine, Ser: 0.56 mg/dL (ref 0.4–1.5)
GFR calc Af Amer: >60 mL/min (ref >60)
GFR calc non Af Amer: >60 mL/min (ref >60)
Glucose, Bld: 224 mg/dL — ABNORMAL HIGH (ref 70–99)
Potassium: 3.1 mEq/L — ABNORMAL LOW (ref 3.5–5.1)
Potassium: 3.9 mEq/L (ref 3.5–5.1)
Sodium: 140 mEq/L (ref 135–145)

## 2010-07-11 LAB — LIPID PANEL
HDL: 17 mg/dL — ABNORMAL LOW (ref >39)
Total CHOL/HDL Ratio: 5.5 RATIO
Triglycerides: 91 mg/dL (ref <150)
VLDL: 18 mg/dL (ref 0–40)

## 2010-07-11 LAB — CBC
HCT: 38.9 % — ABNORMAL LOW (ref 39.0–52.0)
MCV: 85.7 fL (ref 78.0–100.0)
Platelets: 221 10*3/uL (ref 150–400)
RBC: 4.53 MIL/uL (ref 4.22–5.81)
WBC: 9.9 10*3/uL (ref 4.0–10.5)

## 2010-07-12 ENCOUNTER — Encounter: Payer: Self-pay | Admitting: Adult Health

## 2010-07-12 ENCOUNTER — Ambulatory Visit (INDEPENDENT_AMBULATORY_CARE_PROVIDER_SITE_OTHER): Payer: Medicare Other | Admitting: Adult Health

## 2010-07-12 ENCOUNTER — Ambulatory Visit (HOSPITAL_COMMUNITY)
Admission: RE | Admit: 2010-07-12 | Discharge: 2010-07-12 | Disposition: A | Discharge status: Home / Self Care | Payer: Medicare Other | Source: Ambulatory Visit | Attending: Adult Health | Admitting: Adult Health

## 2010-07-12 DIAGNOSIS — I2510 Atherosclerotic heart disease of native coronary artery without angina pectoris: Secondary | ICD-10-CM

## 2010-07-12 DIAGNOSIS — R0602 Shortness of breath: Secondary | ICD-10-CM | POA: Insufficient documentation

## 2010-07-12 DIAGNOSIS — R0789 Other chest pain: Secondary | ICD-10-CM | POA: Insufficient documentation

## 2010-07-12 DIAGNOSIS — I200 Unstable angina: Secondary | ICD-10-CM

## 2010-07-12 DIAGNOSIS — I10 Essential (primary) hypertension: Secondary | ICD-10-CM

## 2010-07-12 DIAGNOSIS — Z7901 Long term (current) use of anticoagulants: Secondary | ICD-10-CM

## 2010-07-12 LAB — URINALYSIS, ROUTINE W REFLEX MICROSCOPIC
Leukocytes, UA: NEGATIVE
Nitrite: NEGATIVE
Specific Gravity, Urine: 1.01 (ref 1.005–1.030)
Urobilinogen, UA: 0.2 mg/dL (ref 0.0–1.0)
pH: 6 (ref 5.0–8.0)

## 2010-07-12 LAB — CARDIAC PANEL(CRET KIN+CKTOT+MB+TROPI)
CK, MB: 2.6 ng/mL (ref 0.3–4.0)
Relative Index: INVALID (ref 0.0–2.5)
Relative Index: INVALID (ref 0.0–2.5)
Total CK: 65 U/L (ref 7–232)
Troponin I: 0.32 ng/mL — ABNORMAL HIGH (ref 0.00–0.06)
Troponin I: 1.71 ng/mL (ref 0.00–0.06)

## 2010-07-12 LAB — PROTIME-INR
INR: 1.1 (ref 0.00–1.49)
INR: 1.1 (ref 0.00–1.49)
Prothrombin Time: 14.5 seconds (ref 11.6–15.2)

## 2010-07-12 LAB — CBC
HCT: 41.7 % (ref 39.0–52.0)
Hemoglobin: 12.8 g/dL — ABNORMAL LOW (ref 13.0–17.0)
Hemoglobin: 13.4 g/dL (ref 13.0–17.0)
Hemoglobin: 13.8 g/dL (ref 13.0–17.0)
Hemoglobin: 14 g/dL (ref 13.0–17.0)
MCHC: 33.3 g/dL (ref 30.0–36.0)
MCHC: 33.5 g/dL (ref 30.0–36.0)
MCHC: 33.7 g/dL (ref 30.0–36.0)
MCHC: 33.8 g/dL (ref 30.0–36.0)
Platelets: 225 10*3/uL (ref 150–400)
Platelets: 236 10*3/uL (ref 150–400)
RBC: 4.73 MIL/uL (ref 4.22–5.81)
RDW: 12.9 % (ref 11.5–15.5)
RDW: 13.1 % (ref 11.5–15.5)
RDW: 13.3 % (ref 11.5–15.5)

## 2010-07-12 LAB — BASIC METABOLIC PANEL
CO2: 29 mEq/L (ref 19–32)
CO2: 31 mEq/L (ref 19–32)
Calcium: 9.4 mg/dL (ref 8.4–10.5)
Chloride: 101 mEq/L (ref 96–112)
Creatinine, Ser: 0.6 mg/dL (ref 0.4–1.5)
Creatinine, Ser: 0.61 mg/dL (ref 0.4–1.5)
GFR calc Af Amer: >60 mL/min (ref >60)
GFR calc Af Amer: >60 mL/min (ref >60)
GFR calc non Af Amer: >60 mL/min (ref >60)
Glucose, Bld: 228 mg/dL — ABNORMAL HIGH (ref 70–99)
Sodium: 135 mEq/L (ref 135–145)

## 2010-07-12 LAB — POCT CARDIAC MARKERS: CKMB, poc: 1.3 ng/mL (ref 1.0–8.0)

## 2010-07-12 LAB — DIFFERENTIAL
Basophils Absolute: 0.1 10*3/uL (ref 0.0–0.1)
Basophils Relative: 1 % (ref 0–1)
Lymphocytes Relative: 32 % (ref 12–46)
Monocytes Relative: 9 % (ref 3–12)
Neutro Abs: 5.2 10*3/uL (ref 1.7–7.7)
Neutrophils Relative %: 55 % (ref 43–77)

## 2010-07-12 LAB — HEPATIC FUNCTION PANEL
Albumin: 3.6 g/dL (ref 3.5–5.2)
Total Bilirubin: 0.8 mg/dL (ref 0.3–1.2)
Total Protein: 6.4 g/dL (ref 6.0–8.3)

## 2010-07-12 LAB — GLUCOSE, CAPILLARY
Glucose-Capillary: 220 mg/dL — ABNORMAL HIGH (ref 70–99)
Glucose-Capillary: 260 mg/dL — ABNORMAL HIGH (ref 70–99)
Glucose-Capillary: 267 mg/dL — ABNORMAL HIGH (ref 70–99)

## 2010-07-12 LAB — LIPID PANEL
Cholesterol: 111 mg/dL (ref 0–200)
LDL Cholesterol: 67 mg/dL (ref 0–99)
Total CHOL/HDL Ratio: 5.3 RATIO

## 2010-07-12 LAB — TROPONIN I: Troponin I: 0.32 ng/mL — ABNORMAL HIGH (ref 0.00–0.06)

## 2010-07-12 LAB — URINE MICROSCOPIC-ADD ON

## 2010-07-12 LAB — CK TOTAL AND CKMB (NOT AT ARMC): CK, MB: 3.6 ng/mL (ref 0.3–4.0)

## 2010-07-12 LAB — APTT: aPTT: 30 seconds (ref 24–37)

## 2010-07-12 MED ORDER — ISOSORBIDE MONONITRATE ER 30 MG PO TB24: 30.0000 mg | ORAL_TABLET | Freq: Every day | ORAL | Status: DC

## 2010-07-12 NOTE — Assessment & Plan Note (Signed)
This is not well controlled at present.  He is on multiple medications for  Hypertension.  He has OSA, but does not tolerate CPAP.  Imdur institution is to be done to assist with symptoms and BP. May need to consider adjusting medsif not well controlled on next visit, post cath.

## 2010-07-12 NOTE — Assessment & Plan Note (Signed)
With recurrent chest pain, I do not feel comfortable releasing him to have bilateral knee surgery with know CAD. His last cath in Jan of 2011 required intervention of the LAD.  Will plan repeat cath to evaluate for patency of stents in the setting of unstable angina.  This may be related to circumstantial stress, but with multiple CVRF's and recurrent pain, it is my opinion that prior to major orthopedic surgery it would be best to evaluate coronaries with catheterization. Once this is concluded, we can make better recommendations to proceed with knee replacements.  In the interim, I have started him on low dose Imdur 30mg  daily to assist with angina. His BP is elevated on multiple medications.  Will follow this.  I have discussed this with the patient and his family. They would rather have catheterization versus stress test for evaluation of chest discomfort.

## 2010-07-12 NOTE — Patient Instructions (Signed)
Your physician recommends that you schedule a follow-up appointment in: after cath Your physician has recommended you make the following change in your medication: imdur 30mg  daily  Your physician has requested that you have a cardiac catheterization. Cardiac catheterization is used to diagnose and/or treat various heart conditions. Doctors may recommend this procedure for a number of different reasons. The most common reason is to evaluate chest pain. Chest pain can be a symptom of coronary artery disease (CAD), and cardiac catheterization can show whether plaque is narrowing or blocking your heart's arteries. This procedure is also used to evaluate the valves, as well as measure the blood flow and oxygen levels in different parts of your heart. For further information please visit [email or web address] Please follow instruction sheet, as given.  Your physician recommends that you return for lab work in: today A chest x-ray takes a picture of the organs and structures inside the chest, including the heart, lungs, and blood vessels. This test can show several things, including, whether the heart is enlarges; whether fluid is building up in the lungs; and whether pacemaker / defibrillator leads are still in place. today

## 2010-07-12 NOTE — Progress Notes (Signed)
HPI : [name] is a 70 y/o obese CM with know history of CAD, with  Percutaneous coronary intervention and stenting of the posterior       descending artery with placement of 2.5 x 18 mm Xience drug-eluting stent,subsequent percutaneous coronary intervention and stenting of mid       left anterior descending and placement of a 2.5 x 15 along with 2.5   x 8 mm Xience drug-eluting stent, and moderate scattered diesease of the      LAD in Jan of 2011 and with widely patent stents to the Cx, ruptured plaque. His other history includes diabetes, hypercholesterolemia, morbid obesity, and severe family stress with care of his dying wife..  He had admission to [facility] ER in March of 2012 with chest pain just before taking his wife to the beach "for the last time."  He admits he suffers from some anxiety concerning his wife's illness. He was ruled out for cardiac etiology of chest pain.  However, he continues to have recurrent pain in his chest, not certain if it is related to anxiety or not.Some DOE.  He is to have bilateral knee replacement at [facility] in June 2012 with a pre-operative appointment with the orthopedic surgeon on June 20th.  He needs cardiac clearance for surgery.  Not on File  Current Outpatient Prescriptions  Medication Sig Dispense Refill  . ALPRAZolam (XANAX) 0.5 MG tablet Take 0.5 mg by mouth 3 (three) times daily.        Marland Kitchen amLODipine (NORVASC) 10 MG tablet Take 10 mg by mouth daily.        Marland Kitchen aspirin 325 MG EC tablet Take 325 mg by mouth daily.        Marland Kitchen atorvastatin (LIPITOR) 20 MG tablet Take 20 mg by mouth daily.        . clopidogrel (PLAVIX) 75 MG tablet Take 75 mg by mouth daily.        Marland Kitchen ibuprofen (ADVIL,MOTRIN) 200 MG tablet Take 200 mg by mouth every 6 (six) hours as needed.        . metFORMIN (GLUCOPHAGE) 1000 MG tablet Take 1,000 mg by mouth 2 (two) times daily with a meal.        . metoprolol (TOPROL-XL) 50 MG 24 hr tablet Take 50 mg by mouth daily.        Marland Kitchen morphine (MS  CONTIN) 60 MG 12 hr tablet Take 60 mg by mouth 2 (two) times daily.        . nitroGLYCERIN (NITROSTAT) 0.4 MG SL tablet Place 0.4 mg under the tongue every 5 (five) minutes as needed.        . Omega-3 Fatty Acids (FISH OIL) 1000 MG CAPS Take 1 capsule by mouth daily.        Marland Kitchen oxyCODONE-acetaminophen (PERCOCET) 10-325 MG per tablet Take 1 tablet by mouth every 4 (four) hours as needed.        . polycarbophil (FIBERCON) 625 MG tablet Take 625 mg by mouth daily.        . potassium chloride (KLOR-CON) 10 MEQ CR tablet Take 10 mEq by mouth 2 (two) times daily. Take 2 tabs bid       . QUEtiapine (SEROQUEL) 50 MG tablet Take 50 mg by mouth at bedtime.        . torsemide (DEMADEX) 100 MG tablet Take 100 mg by mouth daily. 1/2 tab bid        . valsartan (DIOVAN) 160 MG tablet Take 160  mg by mouth daily.        Marland Kitchen DISCONTD: furosemide (LASIX) 80 MG tablet Take 80 mg by mouth daily.          Past Medical History  Diagnosis Date  . Diabetes mellitus   . Hypertension   . Osteoarthritis   . Barrett's esophagus   . Myocardial infarction   . Thyroid disease     hypothyroidism  . Sleep apnea     Past Surgical History  Procedure Date  . Nissen fundoplication   . Carpal tunnel release     bilateral carpal tunnel release    Family History  Problem Relation Age of Onset  . Adopted: Yes    History   Social History  . Marital Status: Married    Spouse Name: N/A    Number of Children: N/A  . Years of Education: N/A   Occupational History  . Not on file.   Social History Main Topics  . Smoking status: Never Smoker   . Smokeless tobacco: Never Used  . Alcohol Use: No  . Drug Use: No  . Sexually Active:    Other Topics Concern  . Not on file   Social History Narrative  . No narrative on file    ROS Review of systems complete and found to be negative unless listed above PHYSICAL EXAM BP 166/78  Pulse 65  Ht 5\' 11"  (1.803 m)  Wt 243 lb (110.224 kg)  BMI 33.89 kg/m2 General:  Well developed, obese, in no acute distress Head: Eyes PERRLA, No xanthomas.   Normal cephalic and atramatic  Lungs: Clear bilaterally to auscultation and percussion. Heart: HRRR S1 S2, with soft S4 murmur.  Pulses are 2+ & equal.            No carotid bruit. No JVD.  No abdominal bruits. No femoral bruits. Abdomen: Bowel sounds are positive, abdomen soft and non-tender without masses or                  Hernia's noted. Obese Msk:  Back normal, normal gait. Normal strength and tone for age. Stiffness and pain with ROM of both knees Extremities: No clubbing, cyanosis or edema.  DP +1 Neuro: Alert and oriented X 3. Psych:  Good affect, responds appropriately   EKG:  NSR rate of 63 bpm  ASSESSMENT AND PLAN

## 2010-07-13 ENCOUNTER — Telehealth: Payer: Self-pay | Admitting: Adult Health

## 2010-07-13 LAB — CBC WITH DIFFERENTIAL/PLATELET
Basophils Absolute: 0 10*3/uL (ref 0.0–0.1)
Basophils Relative: 0 % (ref 0–1)
MCHC: 31.7 g/dL (ref 30.0–36.0)
Monocytes Absolute: 0.8 10*3/uL (ref 0.1–1.0)
Neutro Abs: 6.4 10*3/uL (ref 1.7–7.7)
Neutrophils Relative %: 65 % (ref 43–77)
Platelets: 284 10*3/uL (ref 150–400)
RDW: 14.7 % (ref 11.5–15.5)

## 2010-07-13 LAB — APTT: aPTT: 30 seconds (ref 24–37)

## 2010-07-13 LAB — BASIC METABOLIC PANEL
BUN: 18 mg/dL (ref 6–23)
Potassium: 3.6 mEq/L (ref 3.5–5.3)
Sodium: 142 mEq/L (ref 135–145)

## 2010-07-13 LAB — PROTIME-INR
INR: 1.07 (ref <1.50)
Prothrombin Time: 14.1 seconds (ref 11.6–15.2)

## 2010-07-13 NOTE — Telephone Encounter (Signed)
Pt is requesting  Spinal anesthesia for his catherization.  I explained that this was not likely that he should take his normal pain medication before his procedure.  Pt was not happy and stated that he probably would not have the test

## 2010-07-13 NOTE — Telephone Encounter (Signed)
PT HAS QUESTIONS ABOUT CATH THAT WAS SCHEDULED. HE FEELS LIKE HE WILL NOT MAKE IT THROUGH THIS,. BECAUSE OF HAVING TO LAY SO LONG. HE WANTS TO KNOW IF THEY WILL GIVE HIM MEDICATIONS FOR HIS PAIN.

## 2010-07-17 ENCOUNTER — Inpatient Hospital Stay (HOSPITAL_BASED_OUTPATIENT_CLINIC_OR_DEPARTMENT_OTHER)
Admission: RE | Admit: 2010-07-17 | Discharge: 2010-07-17 | Disposition: A | Discharge status: Home / Self Care | Payer: Medicare Other | Source: Ambulatory Visit | Attending: Cardiology | Admitting: Cardiology

## 2010-07-17 DIAGNOSIS — I2510 Atherosclerotic heart disease of native coronary artery without angina pectoris: Secondary | ICD-10-CM

## 2010-07-17 DIAGNOSIS — R079 Chest pain, unspecified: Secondary | ICD-10-CM | POA: Insufficient documentation

## 2010-07-17 DIAGNOSIS — Z9861 Coronary angioplasty status: Secondary | ICD-10-CM | POA: Insufficient documentation

## 2010-07-17 LAB — POCT I-STAT GLUCOSE: Glucose, Bld: 138 mg/dL — ABNORMAL HIGH (ref 70–99)

## 2010-07-29 NOTE — Cardiovascular Report (Signed)
NAME:  Johnny [name], Johnny [name] NO.:  [id number]  MEDICAL RECORD NO.:  [id number]           PATIENT TYPE:  O  LOCATION:  RAD                           FACILITY:  [facility]  PHYSICIAN:  Lacinda Curvin M. [place], M.D.  DATE OF BIRTH:  June 02, 1940  DATE OF PROCEDURE: DATE OF DISCHARGE:  07/12/2010                           CARDIAC CATHETERIZATION   INDICATIONS FOR PROCEDURE:  A 70 year old white male with history of coronary artery disease who has had prior stenting of the mid left circumflex coronary in the distal right coronary artery.  In January 2011, he also had stenting of the mid LAD and of the posterior descending artery.  He presents now with symptoms of chest pain.  He is also being cleared for bilateral knee replacement surgery.  PROCEDURE:  Left heart catheterization and left ventricular angiography.  ACCESS:  Via the right femoral artery using standard Seldinger technique.  EQUIPMENT:  4-French 5-cm left Judkins catheter, 4-French 3-DRC catheter, 4-French pigtail catheter, 4-French arterial sheath.  MEDICATIONS:  Local anesthesia 1% Xylocaine, Versed 2 mg IV, fentanyl 25 mcg IV, contrast 90 mL of Omnipaque.  HEMODYNAMIC DATA:  Aortic pressure is 147/75 with a mean of 103, left ventricle pressure is 142 with EDP of 22 mmHg.  ANGIOGRAPHIC DATA:  The left coronary artery arises and distributes normally.  The left main coronary artery is normal.  The left anterior descending artery has a 40% stenosis at the takeoff of the first septal perforator.  The overlapping stents in the mid LAD are widely patent with less than 20% residual disease.  There is a very small diagonal branch which has a 70-80% ostial stenosis.  The left circumflex coronary is a large vessel which gives rise to 2 marginal branches distally.  In the midcircumflex, there is a stent noted which is widely patent.  There is a 40% narrowing at the origin of the second marginal branch.  The right coronary  arises and distributes normally.  It is a large dominant vessel.  There is a stent noted in the distal right coronary artery and in the PDA which are widely patent.  There is a segmental 50- 60% stenosis in the mid right coronary artery.  There is an 80-90% stenosis in a small distal posterolateral branch.  Left ventricular angiography performed in the RAO view demonstrates normal left ventricular size with mild global hypokinesis.  Overall ejection fraction is 50%.  FINAL INTERPRETATION: 1. Predominantly nonobstructive atherosclerotic coronary artery     disease.  The prior stents in the mid LAD, mid left circumflex,     distal right coronary artery, and PDA are widely patent.  Compared     to his prior angiographic study in 2011, he has had progressive     disease in the mid right coronary but this appears to be     nonobstructive. 2. Low normal left ventricular function.  PLAN:  We would recommend continued medical therapy.          ______________________________ Shterna Laramee M. [place], M.D.     PMJ/MEDQ  D:  07/17/2010  T:  07/17/2010  Job:  [id number]  cc:  Jonelle Sidle, MD Oneal Deputy. Juanetta Gosling, M.D.  Electronically Signed by Charo Philipp [place] M.D. on 07/29/2010 12:39:06 PM

## 2010-07-30 ENCOUNTER — Ambulatory Visit (INDEPENDENT_AMBULATORY_CARE_PROVIDER_SITE_OTHER): Payer: Medicare Other | Admitting: Adult Health

## 2010-07-30 ENCOUNTER — Encounter: Payer: Self-pay | Admitting: Adult Health

## 2010-07-30 DIAGNOSIS — I25810 Atherosclerosis of coronary artery bypass graft(s) without angina pectoris: Secondary | ICD-10-CM

## 2010-07-30 DIAGNOSIS — I5032 Chronic diastolic (congestive) heart failure: Secondary | ICD-10-CM

## 2010-07-30 DIAGNOSIS — I2510 Atherosclerotic heart disease of native coronary artery without angina pectoris: Secondary | ICD-10-CM

## 2010-07-30 DIAGNOSIS — I10 Essential (primary) hypertension: Secondary | ICD-10-CM

## 2010-07-30 DIAGNOSIS — I219 Acute myocardial infarction, unspecified: Secondary | ICD-10-CM

## 2010-07-30 DIAGNOSIS — I509 Heart failure, unspecified: Secondary | ICD-10-CM

## 2010-07-30 DIAGNOSIS — Z01810 Encounter for preprocedural cardiovascular examination: Secondary | ICD-10-CM

## 2010-07-30 MED ORDER — DOXAZOSIN MESYLATE 4 MG PO TABS: 4.0000 mg | ORAL_TABLET | Freq: Every day | ORAL | Status: DC

## 2010-07-30 MED ORDER — DOXAZOSIN MESYLATE 1 MG PO TABS: 1.0000 mg | ORAL_TABLET | Freq: Every day | ORAL | Status: DC

## 2010-07-30 MED ORDER — VALSARTAN 320 MG PO TABS: 320.0000 mg | ORAL_TABLET | Freq: Every day | ORAL | Status: DC

## 2010-07-30 NOTE — Progress Notes (Signed)
HPI : Johnny [name] is a 70 y/o obese CM patient of Dr. Juanito Doom, with know history of CAD, with  Percutaneous coronary intervention and stenting of the posterior descending artery with placement of 2.5 x 18 mm Xience drug-eluting stent,subsequent percutaneous coronary intervention and stenting of mid left anterior descending and placement of a 2.5 x 15 along with 2.5   x 8 mm Xience drug-eluting stent, and moderate scattered diesease of the      LAD in Jan of 2011 and with widely patent stents to the Cx, ruptured plaque. He has not seen Dr. Daleen Squibb since October of 2011.  His other history includes diabetes, hypercholesterolemia, morbid obesity, and severe family stress with care of his dying wife..  He had admission to [facility] ER in March of 2012 with chest pain just before taking his wife to the beach "for the last time."  He admits he suffers from some anxiety concerning his wife's illness. She died two days ago,prior to this appointment. He has ruled out for cardiac etiology of chest pain during the hospitalization in March..  However, he continues to have recurrent pain in his chest, not certain if it is related to anxiety or not. Some DOE.  He is to have bilateral knee replacement at [facility] in June 2012 with a pre-operative appointment with the orthopedic surgeon on June 20th.  He needs cardiac clearance for surgery.  Because of recurrent chest discomfort and known history of CAD with stents, I requested repeat cardiac catheterization prior to pre-operative clearance.  He is here for results and will also been seen by Dr.Rothbart for physician review/evaluation.  Not on File  Current Outpatient Prescriptions  Medication Sig Dispense Refill  . ALPRAZolam (XANAX) 0.5 MG tablet Take 0.5 mg by mouth 3 (three) times daily.        Marland Kitchen amLODipine (NORVASC) 10 MG tablet Take 10 mg by mouth daily.        Marland Kitchen aspirin 325 MG EC tablet Take 325 mg by mouth daily.        Marland Kitchen atorvastatin (LIPITOR) 20 MG tablet Take  20 mg by mouth daily.        Marland Kitchen doxazosin (CARDURA) 1 MG tablet Take 1 tablet (1 mg total) by mouth at bedtime.  30 tablet  11  . doxazosin (CARDURA) 4 MG tablet Take 1 tablet (4 mg total) by mouth at bedtime.  30 tablet  3  . ibuprofen (ADVIL,MOTRIN) 200 MG tablet Take 200 mg by mouth every 6 (six) hours as needed.        . metFORMIN (GLUCOPHAGE) 1000 MG tablet Take 1,000 mg by mouth 2 (two) times daily with a meal.        . metoprolol (TOPROL-XL) 50 MG 24 hr tablet Take 50 mg by mouth daily.        Marland Kitchen morphine (MS CONTIN) 60 MG 12 hr tablet Take 60 mg by mouth 2 (two) times daily.        . nitroGLYCERIN (NITROSTAT) 0.4 MG SL tablet Place 0.4 mg under the tongue every 5 (five) minutes as needed.        . Omega-3 Fatty Acids (FISH OIL) 1000 MG CAPS Take 1 capsule by mouth daily.        Marland Kitchen oxyCODONE-acetaminophen (PERCOCET) 10-325 MG per tablet Take 1 tablet by mouth every 4 (four) hours as needed.        . polycarbophil (FIBERCON) 625 MG tablet Take 625 mg by mouth daily.        Marland Kitchen  potassium chloride (KLOR-CON) 10 MEQ CR tablet Take 10 mEq by mouth 2 (two) times daily. Take 2 tabs bid       . QUEtiapine (SEROQUEL) 50 MG tablet Take 50 mg by mouth at bedtime.        . torsemide (DEMADEX) 100 MG tablet Take 100 mg by mouth daily. 1/2 tab bid        . valsartan (DIOVAN) 320 MG tablet Take 1 tablet (320 mg total) by mouth daily.  30 tablet  3  . DISCONTD: clopidogrel (PLAVIX) 75 MG tablet Take 75 mg by mouth daily.        Marland Kitchen DISCONTD: isosorbide mononitrate (IMDUR) 30 MG 24 hr tablet Take 1 tablet (30 mg total) by mouth daily.  30 tablet  3  . DISCONTD: valsartan (DIOVAN) 160 MG tablet Take 160 mg by mouth daily.          Past Medical History  Diagnosis Date  . Diabetes mellitus   . Hypertension   . Osteoarthritis   . Barrett's esophagus   . Myocardial infarction   . Thyroid disease     hypothyroidism  . Sleep apnea     Past Surgical History  Procedure Date  . Nissen fundoplication   .  Carpal tunnel release     bilateral carpal tunnel release    Family History  Problem Relation Age of Onset  . Adopted: Yes    History   Social History  . Marital Status: Married    Spouse Name: N/A    Number of Children: N/A  . Years of Education: N/A   Occupational History  . Not on file.   Social History Main Topics  . Smoking status: Never Smoker   . Smokeless tobacco: Never Used  . Alcohol Use: No  . Drug Use: No  . Sexually Active:    Other Topics Concern  . Not on file   Social History Narrative  . No narrative on file    ROS Review of systems complete and found to be negative unless listed above PHYSICAL EXAM BP 158/84  Pulse 72  Ht 5\' 11"  (1.803 m)  Wt 247 lb (112.038 kg)  BMI 34.45 kg/m2  SpO2 93% General: Well developed, obese, in no acute distress Head: Eyes PERRLA, No xanthomas.   Normal cephalic and atramatic  Lungs: Clear bilaterally to auscultation and percussion. Heart: HRRR S1 S2,   Pulses are 2+ & equal.            Soft right carotid bruit. No JVD.  No abdominal bruits. No femoral bruits. Abdomen: Bowel sounds are positive, abdomen soft and non-tender without masses or                  Hernia's noted. Obese Msk:  Back normal, normal gait. Normal strength and tone for age. Stiffness and pain with ROM of both knees Extremities: No clubbing, cyanosis or edema.  DP +1 Neuro: Alert and oriented X 3. Psych:  Good affect, responds appropriately   EKG:  NSR rate of 63 bpm  ASSESSMENT AND PLAN

## 2010-07-30 NOTE — Assessment & Plan Note (Addendum)
He is on high doses of torsemide.  Will start cardura 1mg  X1, then 2mg  for one week, then 4mg  daily. Increase his diovan to 320 mg daily as well for better BP control.  He will continue amlodipine 10mg  as directed.  Changes in medications are per Dr. Dietrich Pates.

## 2010-07-30 NOTE — Assessment & Plan Note (Addendum)
The patient has been seen and examined by myself and by Dr. Dietrich Pates.  He is cleared for bilateral knee replacement with low to moderate risk of cardiac event perioperatively..  We will d/c his plavix as he has been on this for over one year.  We will d/c his isosorbide as well.  He is to continue ASA.  Metoprolol should be continued perioperatively.  We will be happy to follow him if necessary during hospitalization in June.  He will be seen by Dr. Daleen Squibb on follow-up appointment 3 months post-operatively.

## 2010-07-30 NOTE — Patient Instructions (Signed)
Your physician has recommended you make the following change in your medication:  Stop taking Plavix and Isosorbide, increase Diovan to 320mg  daily and start taking Cardura 1mg  for 1 day, then 2mg  (1/2 of 4 mg tablet) for 1 week, then take 4mg  daily there after.  Your physician recommends that you schedule a follow-up appointment in: late August  You have been approved for upcoming surgery, please call this office at [phone] with name of surgeon performing surgery.

## 2010-07-31 ENCOUNTER — Telehealth: Payer: Self-pay

## 2010-08-06 ENCOUNTER — Other Ambulatory Visit: Payer: Self-pay | Admitting: Cardiology

## 2010-08-14 NOTE — Assessment & Plan Note (Signed)
[facility]                                 ON-CALL NOTE   NAME:Kauffman, JAHIEM FRANZONI                     MRN:          [id number]  DATE:04/09/2009                            DOB:          02-Mar-1941    PRIMARY CARDIOLOGIST:  Jesse Sans. Wall, MD, [facility]   PROBLEM:  Mr. Barbian's wife contacted me today, concerned that her  husband has developed some bruising at the recent catheterization site.  He is otherwise doing well, with no complaint of chest pain.  She does  not recall his having had a bruise at this site, when they went home  this past Tuesday.   PLAN:  I instructed the wife to continue monitoring this closely.  They  have a followup appointment in our [facility] this Thursday,  April 13, 2008.  He is to recontact our office tomorrow, in the event  that this progresses and becomes worse.  The patient was quite agreeable  with this recommendation, and welcomed the call back.     Gene Serpe, PA-C  Electronically Signed    GS/MedQ  DD: 04/09/2009  DT: 04/09/2009  Job #: [id number]

## 2010-08-14 NOTE — Cardiovascular Report (Signed)
NAME:  Johnny [name], Johnny [name] NO.:  [id number]   MEDICAL RECORD NO.:  [id number]           PATIENT TYPE:   LOCATION:                                 FACILITY:   PHYSICIAN:  Arturo Morton. Riley Kill, MD, FACCDATE OF BIRTH:  [birth year]   DATE OF PROCEDURE:  06/29/2008  DATE OF DISCHARGE:                            CARDIAC CATHETERIZATION   INDICATIONS:  This gentleman is a 70 year old who presents with a non-ST-  elevation MI.  He has multiple cardiac risk factors including  hypertension, diabetes, and hypercholesterolemia.  He has had Barrett  esophagus and a Nissen fundoplication.  He also has sleep apnea but  refuses to wear mask.  The current study was done to assess coronary  anatomy.   PROCEDURES:  1. Left heart catheterization.  2. Selective coronary arteriography.  3. Selective left ventriculography.  4. Percutaneous transluminal coronary angioplasty and stenting of the      circumflex coronary artery x2 using 2 drug-eluting stents.  5. Femoral closure with Angio-Seal.   DESCRIPTION OF THE PROCEDURE:  The patient was brought to the  catheterization laboratory, prepped and draped in the usual fashion.  Through an anterior puncture, the right femoral artery was easily  entered in the first stick.  A 5-French sheath was then placed.  We had  to use a JL5 to gain access to the left coronary.  A Williams catheter  was used for the right central aortic and left ventricular pressures,  were measured with pigtail.  Ventriculography was performed in the RAO  projection.  The patient had moderate disease of the LAD but high-grade  lesion in the proximal circumflex.  In addition, there was a slightly  hazy lesion more distally prior to the bifurcation of the 2 marginal sub  branches.  There was noncritical disease of the right.  I discussed the  case with the patient.  I also discussed it with the wife.  As the  patient was having fairly severe back pain, so we tried to keep  him  comfortable with a multiple narcotic analgesics as well as the midazolam  and Valium.  We had difficulty throughout the procedure because of his  low back discomfort.  I did discuss with him the approach to treatment  and then subsequently spoke with his wife and his daughter, as well as  his grandchildren.  This was all thoroughly discussed.  The patient  wanted to proceed with percutaneous intervention.  Preparations were  then made for such.  Because the patient had not had previous  clopidogrel, we gave him clopidogrel load of 600 mg.  In addition, I  elected to use unfractionated heparin and eptifibatide.  Double bolus  eptifibatide was then administered along with heparin, and the ACT was  checked for appropriate intervention.  A JL5 guiding catheter was then  utilized.  We used a Prowater wire down the artery.  Predilatation was  done with a 2.25 x 12 Apex balloon.  We then stented the proximal lesion  using a 3.0 x 18 XIENCE V drug-eluting stent.  This was taken to about  15  atmospheres.  There was marked improvement in the appearance of the  artery.  We postdilated using a 3.5 [place] Voyager, and inflations up to 15  atmospheres to 16 atmospheres in the center of the stent were performed  and additional inflations at the edges of the stent at slightly lower  pressure.  Following this, I carefully did additional shots to assess  the more distal lesion.  It appeared moderately hazy, and therefore, we  directly stented this vessel using a 3.0 x 12 XIENCE V.  This was  deployed at 14-15 atmospheres and then postdilated with a 3.25 x 8 post  dilatation balloon.  There was marked improvement in all of the  arteries.  We then carefully prepped the right groin, exchanged gloves,  and an Angio-Seal femoral closure was performed using Angio-Seal device.  Good hemostasis was achieved.   There were no complications.   HEMODYNAMIC DATA:  1. Central aortic pressure 165/94, mean 123.  2.  Left ventricular pressure 166/12.  3. No gradient or pullback across the aortic valve.   ANGIOGRAPHIC DATA:  1. Ventriculography in the RAO projection revealed preserved global      systolic function.  It was difficult to image, but I did not see      significant mitral regurgitation.  2. The left main is free of critical disease.  3. The LAD courses to the apex.  There is about 70% area of stenosis      just prior to the septal perforator.  More distally, there was      about 60% segmental plaquing and there was diffuse segmental      plaquing throughout the distal vessel.  High-grade critical disease      is not noted.  4. There was a small ramus intermedius without significant narrowing.  5. There was a large circumflex that provides a bifurcating marginal.      There was a 95% somewhat segmental stenosis, which is stented using      a 3.0 x 18 XIENCE V stent with post dilatation up to 35.  This      showed marked improvement with reduction to 0% residual luminal      narrowing.  There was a second stenosis of about 70%, which is      eccentric and fairly short and discrete.  This was stented using a      3.0 x 12 XIENCE V stent, postdilated to 3.25 with reduction in the      stenosis to 0%.  Distally, the superior large subbranch of the      marginal has probably 30% ostial plaquing, in the inferior branch      probably 50-60% plaquing.  6. The right coronary artery has mild luminal irregularity and about      50% area of segmental plaque prior to the PDA and PLA takeoff.  The      distal PDA has about 70% area of narrowing.  There is a third      posterolateral branch, which is really quite small, which has a      slightly hazy 80% stenosis, but this is not suitable for      percutaneous intervention.   CONCLUSIONS:  1. Preserved left ventricular function.  2. Scattered moderate disease of the left anterior descending as noted      above.  3. Critical disease and tandem  lesions of the circumflex with      successful percutaneous stenting.  4. Noncritical disease  of the right coronary artery as noted above.   DISPOSITION:  Aggressive risk factor reduction should be undertaken.  The patient is to remain on aspirin and Plavix for minimum of 1 year and  thereafter based on the recommendations of the cardiologists.  Aspirin  should be continued indefinitely.  Followup will be with Dr. Juanetta Gosling and  Dr. Daleen Squibb.      Arturo Morton. Riley Kill, MD, [facility]  Electronically Signed     TDS/MEDQ  D:  06/29/2008  T:  06/30/2008  Job:  [id number]   cc:   Ramon Dredge L. Juanetta Gosling, M.D.  Thomas C. Wall, MD, [facility]  CV Laboratory

## 2010-08-14 NOTE — H&P (Signed)
NAME:  Johnny [name], Johnny [name] NO.:  [id number]   MEDICAL RECORD NO.:  [id number]          PATIENT TYPE:  EMS   LOCATION:  ED                            FACILITY:  [facility]   PHYSICIAN:  Thomas C. Wall, MD, FACCDATE OF BIRTH:  Jun 13, 1940   DATE OF ADMISSION:  06/28/2008  DATE OF DISCHARGE:                              HISTORY & PHYSICAL   PRIMARY CARE PHYSICIAN:  Edward L. Juanetta Gosling, M.D.   CHIEF COMPLAINT:  Chest pain.   HISTORY OF PRESENT ILLNESS:  Mr. Kliethermes is a 67-year male with multiple  cardiac risk factors including hypertension, diabetes mellitus and  hyperlipidemia, who has complained of 2 weeks of exertional chest  heaviness and shortness of breath often relieved by rest.  He developed  symptoms at rest approximately 4-5 days ago.  His symptoms will last  about 30 minutes to an hour.  He has taken aspirin on a couple of  occasions with some relief.  His symptoms have really occurred daily  since they began 4-5 days ago at rest.  He notes a substernal heaviness,  associated shortness of breath and radiation to his left arm as well as  associated diaphoresis.  He denies syncope.  He called his primary care  physician's office today and was directed to come to the emergency room.  In the emergency room, he does have some upsloping ST depression in V4-  V6 on his electrocardiogram.  He has a troponin of 0.32.  We have been  asked to further evaluate him.  Currently, he is in the process of being  transferred to [facility] for further evaluation and treatment.   PAST MEDICAL HISTORY:  As outlined above.  In addition:  1. GERD.  2. Barrett's esophagus.  3. Status post Nissen fundoplication.  4. History of back surgery.  5. Chronic back pain.  6. Bilateral carpal tunnel release.  7. Osteoarthritis.  8. History of echocardiogram April 2006 demonstrating EF of 55-60% and      moderate LVH.  9. Sleep apnea - the patient refuses CPAP.   MEDICATIONS AT HOME:  1. Actos 30 mg a day.  2. Oxycodone/APAP 10/325 mg p.r.n.  3. Alprazolam 0.5 mg daily p.r.n.  4. Amlodipine/benazepril 5/20 mg daily.  5. Morphine sulfate 15 mg 1 tablet b.i.d.  6. Arthrotek 75/0.2 mg daily.  7. Simvastatin 40 mg q.h.s.  8. Micardis/HCTZ 80/12.5 mg daily.  9. Cyclobenzaprine 10 mg daily to twice daily p.r.n.  10.Metformin 500 mg b.i.d.   ALLERGIES:  NO KNOWN DRUG ALLERGIES.   SOCIAL HISTORY:  The patient lives in [place] with his wife.  He denies  ever smoking cigarettes or abusing alcohol.  He is disabled secondary to  back problems.   FAMILY HISTORY:  Cannot be obtained secondary to the patient being  adopted.  He knows nothing about his family history.   REVIEW OF SYSTEMS:  Please see HPI.  He denies fevers, but has had  chills.  Denies headache or sore throat.  Denies rash.  He does note  some symptoms consistent with orthopnea as well as paroxysmal nocturnal  dyspnea.  He does report symptoms that are consistent with orthopnea as  well as paroxysmal nocturnal dyspnea as well as lower extremity edema.  He denies cough.  Denies dysuria or hematuria.  Denies bright red blood  per rectum or melena.  He does note dysphagia.  Denies any significant  dyspepsia.  All other systems reviewed and negative.   PHYSICAL EXAMINATION:  GENERAL:  He is a well-nourished, well-developed  male.  VITAL SIGNS:  Blood pressure 156/95, pulse 66, respirations 16,  temperature 97.8, oxygen saturation 96% on 2 liters.  HEENT:  Normal.  NECK:  Mild JVD.  LYMPH:  Without lymphadenopathy.  ENDOCRINE:  Without thyromegaly.  CARDIAC:  Normal S1 and S2, regular rate and rhythm without murmur.  LUNGS:  Decreased breath sounds at the bases.  No rales, no wheezing.  SKIN:  Warm and dry without rash.  ABDOMEN:  Soft, nontender with normoactive bowel sounds.  No  organomegaly.  EXTREMITIES:  Without clubbing, cyanosis or edema.  MUSCULOSKELETAL:  Without joint deformity.  NEUROLOGIC:  He is  alert and oriented x3.  Cranial nerves II XII are  grossly intact.  VASCULAR:  No carotid bruits noted bilaterally.  Femoral artery pulses are 2+ bilaterally without appreciable bruits.   DIAGNOSTICS:  1. Chest x-ray:  Cardiomegaly, vascular congestion, right base scar      versus atelectasis.  2. EKG:  Sinus rhythm with a heart rate of 73, normal axis, upsloping      ST depression in V4-V6.   LABORATORY DATA:  Hemoglobin 13.8, potassium 3.4, creatinine 0.6,  glucose 318, CK-MB 3.6, troponin 0.32.   ASSESSMENT:  1. Non-ST elevation myocardial infarction.  2. Diabetes mellitus.  3. Hyperlipidemia.  4. Hypertension.  5. Gastroesophageal reflux disease.  6. Chronic low back pain.   RECOMMENDATIONS:  The patient was also interviewed and evaluated by Dr.  Daleen Squibb today.  We plan to transfer him directly to [facility] for  further evaluation and treatment.  We recommend proceeding with cardiac  catheterization to further assess his symptoms and provide treatment.  The risks and benefits of the procedure have been explained to the  patient and his family, and he agrees to proceed.  He will be placed on  the schedule for cardiac catheterization later this afternoon.  He has  had nothing to eat since this morning.  He will be kept n.p.o.  His home  medications will be continued except his metformin will be held for his  cardiac catheterization as well as his HCTZ.  His Arthrotec will be held  at this time.  He will be given potassium to supplement his hypokalemia.  PT/PTT will be  checked now in preparation for his cardiac catheterization.  He will be  placed on aspirin as well as IV heparin.  He will also be placed on  metoprolol 25 mg b.i.d.  Further recommendations to follow after his  cardiac catheterization later today.      Tereso Newcomer, PA-C      Jesse Sans. Daleen Squibb, MD, [facility]  Electronically Signed    SW/MEDQ  D:  06/28/2008  T:  06/28/2008  Job:  [id number]   cc:    Ramon Dredge L. Juanetta Gosling, M.D.

## 2010-08-14 NOTE — Discharge Summary (Signed)
NAME:  Johnny [name], Johnny [name] NO.:  [id number]   MEDICAL RECORD NO.:  [id number]          PATIENT TYPE:  INP   LOCATION:  2006                         FACILITY:  [facility]   PHYSICIAN:  Luis Abed, MD, FACCDATE OF BIRTH:  [birth year]   DATE OF ADMISSION:  06/28/2008  DATE OF DISCHARGE:  07/01/2008                               DISCHARGE SUMMARY   DISCHARGING PHYSICIAN:  Luis Abed, MD, [facility]   PRIMARY CARDIOLOGIST:  Jesse Sans. Daleen Squibb, MD, [facility]   PRIMARY CARE PHYSICIAN:  Edward L. Juanetta Gosling, MD   DISCHARGING DIAGNOSIS:  1. Coronary artery disease/non-ST-elevated myocardial infarction      a.     Cardiac catheterization /percutaneous coronary intervention       this admission.  The patient with normal left ventricular function       with subtotal circumflex with 70% mid stenosis, status post drug-       eluting stents x2 with the Xience stents.  The patient with       moderate left anterior descending disease and distal right       coronary artery disease which is noncritical.  Recommendations for       medical therapy including aggressive risk factor management,       aspirin and Plavix.  2. Diabetes.  3. Dyslipidemia.   PAST MEDICAL HISTORY:  GERD, Barrett esophagus, status post Nissen  fundoplication, back surgery, chronic back pain, bilateral carpal tunnel  release, osteoarthritis, and sleep apnea with the patient refusing CPAP.   HOSPITAL COURSE:  [name] is a 70 year old gentleman with history of  hypertension, diabetes, and dyslipidemia, who presented with 2 weeks of  exertional chest heaviness, relieved with rest, initially evaluated at  [facility] by Dr. Daleen Squibb and ultimately transferred to [facility]  for further evaluation.  After EKG was evaluated, it showed sinus rhythm  with an upsloping ST depression in V4 and 46.  The patient was taken to  the Cath Lab with a non-STEMI by Dr. Shawnie Pons, results as stated  above.  The patient tolerated  the procedure without complications,  transferred to telemetry, remained in stable condition, tolerating  medications without problems.  Dr. Myrtis Ser in to see the patient on the day  of discharge.  The patient mildly hypokalemia, potassium supplemented,  will need a BMET checked outpatient, will follow up with Dr. Daleen Squibb in  [place] office within the next week.  The patient will need to call  and arrange appointment as office is closed today.  Follow up with Dr.  Juanetta Gosling in 3-4 weeks.  Post-cardiac catheterizations discharge  instructions have been reviewed with the patient.   He is to continue his home medications including:  1. Actos 30.  2. Vicodin p.r.n.  3. Xanax p.r.n.  4. Amlodipine and benazepril 5/20 daily.  5. Morphine p.r.n.  6. Arthrotec daily.  7. Simvastatin 40.  8. Micardis 80/12.5 mg.  9. HCTZ daily.  10.Flexeril as previously.  11.Metformin, the patient may resume this on July 02, 2008.   New medications include:  1. Aspirin 325.  2. Toprol-XL 50.  3. Nitroglycerin as needed.  4. Plavix 75 mg daily.   The patient has prescriptions for the Toprol, nitroglycerin, and Plavix.  Also, we will start the patient on a low-dose KCl at 10 mEq daily and  have a BMET drawn on him on Monday.  We will call the [place] office  to arrange this.   Duration of discharge encounter over 30 minutes.      Dorian Pod, ACNP      Luis Abed, MD, [facility]  Electronically Signed    MB/MEDQ  D:  07/01/2008  T:  07/01/2008  Job:  [id number]   cc:   Ramon Dredge L. Juanetta Gosling, M.D.

## 2010-08-14 NOTE — Assessment & Plan Note (Signed)
[facility]                                 ON-CALL NOTE   NAME:[name], Johnny HAUGHEY                     MRN:          [id number]  DATE:04/09/2009                            DOB:          [birth year]    On the evening of April 09, 2009, I received a page via the answering  service from [name]'s wife due to her concern over her husband's  symptom of severe groin pain and she also noticed that he had some  edema, ecchymosis, and induration at the same site.  The patient had  undergone cardiac catheterization with successful PCI of the LAD using  Xience drug-eluting stent on April 03, 2009.  He was discharged without  immediate complications at that time.  Secondary to the patient's likely  groin bleed with concern for severity of his pain as well as concern for  potential compartment syndrome and developing anemia, I instructed Mrs.  Robillard to take her husband immediately to [facility] as that  was the closest emergency room.  She indicated that she would do this as  soon as she hung up the phone.     Jarrett Ables, [facility]     MS/MedQ  DD: 04/09/2009  DT: 04/10/2009  Job #: [id number]

## 2010-08-14 NOTE — Assessment & Plan Note (Signed)
[facility]                       [place] CARDIOLOGY OFFICE NOTE   NAME:Johnny [name], Johnny [name]                     MRN:          [id number]  DATE:07/15/2008                            DOB:          21-Feb-1941    Johnny [name] returns today after being discharged from the hospital with a non-  STEMI followed by a PCI of the left circumflex.  He had moderate disease  in his LAD, which is being treated medically.  He had 2 drug-eluting  stents placed with an excellent result.   Since discharge, he has had no further ischemic symptoms other than on  the fifth day post discharge while at [facility] he felt lightheaded,  clammy, and sweaty.  He responded to sublingual nitroglycerin spray.   He has multiple cardiac risk factors and was taking very poor care of  himself prior to admission.  This was reinforced by his wife.  Specifically, he is overweight, had poorly-controlled diabetes, mixed  hyperlipidemia, and hypertension.  He does not smoke.  He does have  sleep apnea, but refuses to wear a mask.  He is followed on a regular  basis by Dr. Shaune Pollack.   MEDICINES TODAY:  1. Amlodipine/benazepril 5/20 two tablets daily.  2. Plavix 75 mg a day.  3. Simvastatin 40 mg per day.  4. Metformin 500 mg p.o. b.i.d.  5. Cyclobenzaprine 10 mg p.o. b.i.d.  6. Micardis 80/HCTZ 12.5 nightly.  7. Arthrotec 75/200 b.i.d.  8. Potassium 10 mEq a day.  9. Metoprolol 50 mg per day.  10.Actos 30 mg per day.  11.Ecotrin 325 mg a day.  12.He carries sublingual nitroglycerin spray.   ALLERGIES:  He has known drug allergies.   PHYSICAL EXAMINATION:  GENERAL:  Today, he is in no acute distress.  He  is alert and oriented x3.  He has little bit of the speech impediment.  VITAL SIGNS:  His blood pressure is 140/80, his pulse is 54 and regular.  His height is 5 feet 11 inches.  He weighs 242.  SKIN:  Color is better than it was in the ED.  NECK:  Supple.  Carotid upstrokes are equal  bilaterally without bruits.  There is no JVD.  Thyroid is not enlarged.  Trachea is midline.  CHEST:  Lungs are clear to auscultation and percussion.  HEART:  Poorly appreciated PMI.  Normal S1 and S2.  No gallop, rub, or  murmur.  ABDOMEN:  Obese.  Good bowel sounds.  It was difficult to assess for any  significant abnormalities.  EXTREMITIES:  No significant edema.  Pulses were present 2+ or 4+  bilaterally symmetrical.  No sign of DVT.  NEUROLOGIC:  Grossly intact.  SKIN:  No significant ecchymosis.   His EKG shows sinus bradycardia, otherwise unremarkable.   ASSESSMENT AND PLAN:  Johnny [name] is doing well after having a non-ST-  segment elevation myocardial infarction.  Prior to this event, he was in  major denial in terms of taking care of himself including his diabetes  and hypertension.  He now seems to see the light as he states today.  His wife is also  reinforcing much healthier lifestyle.   I have reinforced all of his medications the importance of each  including Plavix and aspirin for at least a year and perhaps longer.  I  have also told him not to let anyone his Plavix without clearance.   In addition, I have talked about tight blood sugar control, tight blood  pressure control, and aggressive lipid lowering and reducing future  vascular events.  We will arrange for him to come back for followup  lipids and LFTs in about 4 weeks.   I will plan on seeing him back in 5 months.   All questions were answered.  Greater than 30 minutes was spent with the  patient care today.      Thomas C. Daleen Squibb, MD, [facility]  Electronically Signed    TCW/MedQ  DD: 07/15/2008  DT: 07/16/2008  Job #: [id number]

## 2010-08-15 ENCOUNTER — Ambulatory Visit: Payer: Self-pay | Admitting: Cardiology

## 2010-08-17 NOTE — Procedures (Signed)
NAME:  Johnny [name], Johnny [name] NO.:  [id number]   MEDICAL RECORD NO.:  [id number]          PATIENT TYPE:  OUT   LOCATION:  RAD                           FACILITY:  [facility]   PHYSICIAN:  Vida Roller, M.D.   DATE OF BIRTH:  [birth year]   DATE OF PROCEDURE:  07/19/2004  DATE OF DISCHARGE:                                  ECHOCARDIOGRAM   PRIMARY CARE PHYSICIAN:  Dr. Juanetta Gosling   Tape #[id number].  Tape count [id number]   This is a 70 year old man with abdominal swelling.  Technical quality of the  study is poor.   M-MODE TRACINGS:  The aorta is 37 mm.   Left atrium is 48 mm.   Septum is 17 mm.   Posterior wall is 16 mm.   Left ventricular diastolic dimension is 53 mm.   Left ventricular systolic dimension is 35 mm.   2-D AND DOPPLER IMAGING:  The left ventricle is normal size with preserved  left ventricular systolic function, estimated ejection fraction of 55-60%.  There is moderate concentric left ventricular hypertrophy, no obvious wall  motion abnormalities, although the inferior wall is not well seen.   The right ventricle is normal size and normal systolic function.   The left atrium is enlarged.  The right atrium was difficult to see.   The aortic valve is sclerotic with no aortic stenosis or significant  regurgitation.   The mitral valve has mild annular calcification with trivial mitral  regurgitation.  No stenosis is seen.   Tricuspid valve is not well seen.   The pulmonic valve is not well seen.   There is no obvious pericardial effusion.   The ascending aorta and the inferior vena cava were not well seen.      JH/MEDQ  D:  07/19/2004  T:  07/19/2004  Job:  [id number]

## 2010-08-28 ENCOUNTER — Telehealth: Payer: Self-pay | Admitting: Cardiology

## 2010-08-28 NOTE — Telephone Encounter (Signed)
Pt calling b/c he picked up his automatic refills from [facility] today and found a prescription for Imdur.  He does not remember this being prescribed. In checking his ov at [place], Imdur was started on 07/12/10 and discontinued on 08/26/2010.  Pt says he does not remember appt of 08/26/2022 as his wife died over the weekend prior.  He will call the [place] office and confirm with Lorin Picket NP regarding Imdur.  He has not been taking Imdur. Mylo Red RN

## 2010-08-28 NOTE — Telephone Encounter (Signed)
Patient has questions about medications he just picked up from Pharmacy / states that there was one there that he hasn't been taking / tg

## 2010-08-28 NOTE — Telephone Encounter (Signed)
Pt has question re his meds. Pt would like to talk to a nurse.

## 2010-08-29 ENCOUNTER — Telehealth: Payer: Self-pay | Admitting: Cardiology

## 2010-08-30 NOTE — Telephone Encounter (Signed)
S: pt called for cardiology clearance for a total left knee replacement by Dr. Almyra Free at [facility]: last office visit on 07/30/10 by K. Lawrence,NP, orders placed at that time-Stop taking Plavix and Isosorbide, increase Diovan to 320mg  daily and start taking Cardura 1mg  for 1 day, then 2mg  (1/2 of 4 mg tablet) for 1 week, then take 4mg  daily there after.  A: last labs cbc,bmp 07/13/10, pt denies any problems since office visit, only c/o is his knee R:requested last office visit and surgical clearance form from Dr. Almyra Free

## 2010-10-17 ENCOUNTER — Telehealth: Payer: Self-pay | Admitting: *Deleted

## 2010-10-17 NOTE — Telephone Encounter (Signed)
Pt. Having surgery and wants to wait until October to schedule Endo/colon.  Recall put in Epic.

## 2010-10-30 ENCOUNTER — Encounter: Payer: Self-pay | Admitting: Cardiology

## 2010-11-08 ENCOUNTER — Ambulatory Visit: Payer: Medicare Other | Admitting: Cardiology

## 2010-11-20 ENCOUNTER — Encounter (INDEPENDENT_AMBULATORY_CARE_PROVIDER_SITE_OTHER): Payer: Self-pay

## 2010-11-26 ENCOUNTER — Encounter: Payer: Self-pay | Admitting: Adult Health

## 2010-11-26 ENCOUNTER — Ambulatory Visit (INDEPENDENT_AMBULATORY_CARE_PROVIDER_SITE_OTHER): Payer: Medicare Other | Admitting: Adult Health

## 2010-11-26 DIAGNOSIS — I10 Essential (primary) hypertension: Secondary | ICD-10-CM

## 2010-11-26 DIAGNOSIS — I2510 Atherosclerotic heart disease of native coronary artery without angina pectoris: Secondary | ICD-10-CM

## 2010-11-26 DIAGNOSIS — I5032 Chronic diastolic (congestive) heart failure: Secondary | ICD-10-CM

## 2010-11-26 DIAGNOSIS — I509 Heart failure, unspecified: Secondary | ICD-10-CM

## 2010-11-26 MED ORDER — NITROGLYCERIN 0.4 MG SL SUBL: 0.4000 mg | SUBLINGUAL_TABLET | SUBLINGUAL | Status: DC | PRN

## 2010-11-26 NOTE — Assessment & Plan Note (Addendum)
He is doing very well from cardiac standpoint. He has no symptoms of chest discomfort or dyspnea. He is becoming more active and is exercising daily.  He will be continued on current medications at this time. We will see him in 6 months.

## 2010-11-26 NOTE — Progress Notes (Signed)
HPI:  Mr. Partch is a 70 y/o patient of Dr.Wall we are seeing for ongoing treatment and assessment of known coronary artery disease, we are seeing post TKR of the right knee. He is doing very well post operatively and has lost 17 lbs with diet and exercise. He has finished rehab for his knee and remains active. He still gets emotional at the death of his wife in 07/27/2010.  He denies chest pain, shortness of breath or edema. No Known Allergies  Current Outpatient Prescriptions  Medication Sig Dispense Refill  . ALPRAZolam (XANAX) 0.5 MG tablet Take 0.5 mg by mouth 3 (three) times daily.        Marland Kitchen amLODipine (NORVASC) 10 MG tablet Take 10 mg by mouth daily.        Marland Kitchen aspirin 325 MG EC tablet Take 325 mg by mouth daily.        Marland Kitchen atorvastatin (LIPITOR) 20 MG tablet Take 20 mg by mouth daily.        Marland Kitchen doxazosin (CARDURA) 4 MG tablet Take 1 tablet (4 mg total) by mouth at bedtime.  30 tablet  3  . KLOR-CON M10 10 MEQ tablet TAKE 2 TABLETS BY MOUTH TWICE A DAY  120 tablet  3  . metFORMIN (GLUCOPHAGE) 1000 MG tablet Take 1,000 mg by mouth 2 (two) times daily with a meal.        . metoprolol (TOPROL-XL) 50 MG 24 hr tablet Take 50 mg by mouth daily.        . Misc Natural Products (FIBER 7 PO) Take by mouth daily.        Marland Kitchen morphine (MS CONTIN) 60 MG 12 hr tablet Take 60 mg by mouth 2 (two) times daily.        . nitroGLYCERIN (NITROSTAT) 0.4 MG SL tablet Place 0.4 mg under the tongue every 5 (five) minutes as needed.        . Omega-3 Fatty Acids (FISH OIL) 1000 MG CAPS Take 1 capsule by mouth daily.        Marland Kitchen oxyCODONE-acetaminophen (PERCOCET) 10-325 MG per tablet Take 1 tablet by mouth every 4 (four) hours as needed.        . polysaccharide iron (NIFEREX) 150 MG CAPS capsule Take 150 mg by mouth daily.        . potassium chloride (KLOR-CON) 10 MEQ CR tablet Take 10 mEq by mouth 2 (two) times daily. Take 2 tabs bid       . pregabalin (LYRICA) 75 MG capsule Take 75 mg by mouth 2 (two) times daily.          . QUEtiapine (SEROQUEL) 50 MG tablet Take 50 mg by mouth at bedtime.        . torsemide (DEMADEX) 100 MG tablet Take 100 mg by mouth daily. 1/2 tab bid       . valsartan (DIOVAN) 320 MG tablet Take 1 tablet (320 mg total) by mouth daily.  30 tablet  3  . vitamin C (ASCORBIC ACID) 500 MG tablet Take 500 mg by mouth daily.        Marland Kitchen DISCONTD: doxazosin (CARDURA) 1 MG tablet Take 1 tablet (1 mg total) by mouth at bedtime.  30 tablet  11    Past Medical History  Diagnosis Date  . Diabetes mellitus   . Hypertension   . Osteoarthritis   . Barrett's esophagus   . Myocardial infarction   . Thyroid disease     hypothyroidism  . Sleep  apnea   . GERD (gastroesophageal reflux disease)     Past Surgical History  Procedure Date  . Nissen fundoplication   . Carpal tunnel release     bilateral carpal tunnel release  . Back surgery   . Upper gastrointestinal endoscopy 01/28/08  . Upper gastrointestinal endoscopy 08/21/04    AVW:[id number] of systems complete and found to be negative unless listed abovePHYSICAL EXAM BP 128/74  Pulse 75  Resp 18  Ht 5\' 11"  (1.803 m)  Wt 233 lb (105.688 kg)  BMI 32.50 kg/m2 General: Well developed, well nourished, in no acute distress Head: Eyes PERRLA, No xanthomas.   Normal cephalic and atramatic  Lungs: Clear bilaterally to auscultation and percussion. Heart: HRRR S1 S2, .  Pulses are 2+ & equal.            No carotid bruit. No JVD.  No abdominal bruits. No femoral bruits. Abdomen: Bowel sounds are positive, abdomen soft and non-tender without masses or                  Hernia's noted. Msk:  Back normal, normal gait. Normal strength and tone for age. Extremities: No clubbing, cyanosis or edema.Well healed postoperative scar on the right knee.  DP +1 Neuro: Alert and oriented X 3. Psych:  Good affect, responds appropriately   ASSESSMENT AND PLAN

## 2010-11-26 NOTE — Patient Instructions (Signed)
Continue all current medications. Your physician wants you to follow up in: 6 months.  You will receive a reminder letter in the mail one-two months in advance.  If you don't receive a letter, please call our office to schedule the follow up appointment   

## 2010-11-26 NOTE — Assessment & Plan Note (Signed)
Very well controlled currently. He is seen by Dr. Juanetta Gosling and has had labs drawn 10 days ago. He was called to say all were [facility].  We request copies.

## 2010-11-27 ENCOUNTER — Telehealth: Payer: Self-pay | Admitting: Adult Health

## 2010-11-27 NOTE — Telephone Encounter (Signed)
Pt has a saliva problem and cannot take tabs they will not dissolve needs nitro spray called in.

## 2010-11-28 ENCOUNTER — Other Ambulatory Visit: Payer: Self-pay | Admitting: *Deleted

## 2010-11-28 MED ORDER — VALSARTAN 320 MG PO TABS: 320.0000 mg | ORAL_TABLET | Freq: Every day | ORAL | Status: DC

## 2010-11-30 ENCOUNTER — Telehealth: Payer: Self-pay

## 2010-11-30 MED ORDER — NITROGLYCERIN 0.4 MG/SPRAY TL SOLN: 1.0000 | Status: DC | PRN

## 2010-11-30 NOTE — Telephone Encounter (Signed)
.   Requested Prescriptions   Signed Prescriptions Disp Refills  . nitroGLYCERIN (NITROLINGUAL) 0.4 MG/SPRAY spray 12 g 3    Sig: Place 1 spray under the tongue every 5 (five) minutes as needed for chest pain.    Authorizing Provider: Joni Reining    Ordering User: Lacie Scotts

## 2010-12-09 ENCOUNTER — Other Ambulatory Visit: Payer: Self-pay | Admitting: Cardiology

## 2010-12-18 ENCOUNTER — Other Ambulatory Visit: Payer: Self-pay | Admitting: *Deleted

## 2010-12-18 MED ORDER — DOXAZOSIN MESYLATE 4 MG PO TABS: 4.0000 mg | ORAL_TABLET | Freq: Every day | ORAL | Status: DC

## 2011-01-02 IMAGING — CR DG LUMBAR SPINE COMPLETE 4+V
6 series · 6 of 6 positions shown · non-contrast
Comparison: MRI examination of 11/24/2006

CLINICAL DATA: Low back pain radiating down the right leg.  There
are low back surgery.

LUMBAR SPINE - COMPLETE 4+ VIEW

[view not recorded (1 of 6)]
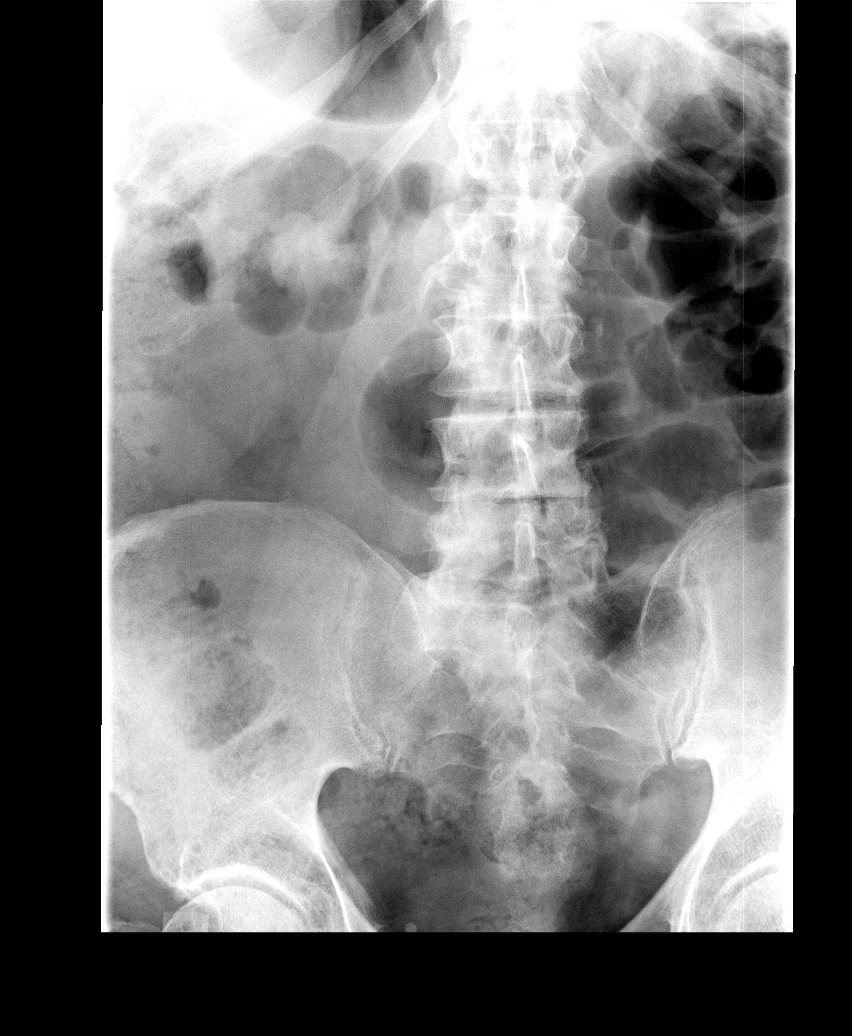

[view not recorded (2 of 6)]
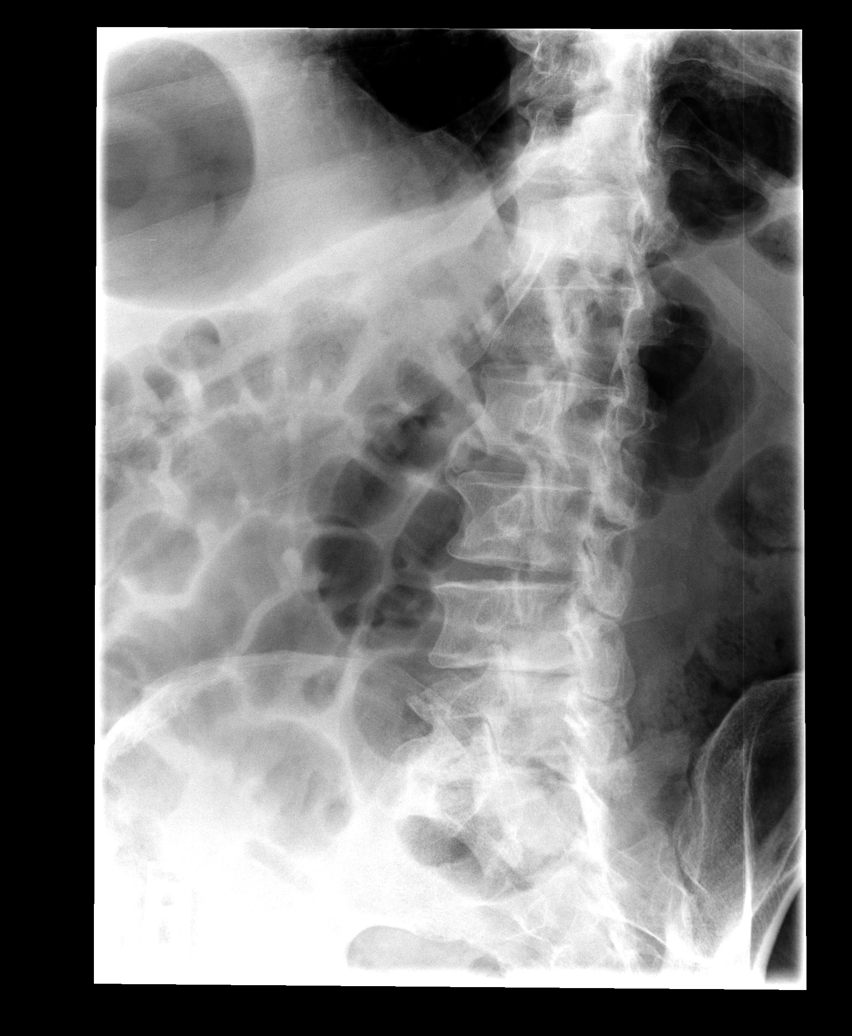

[view not recorded (3 of 6)]
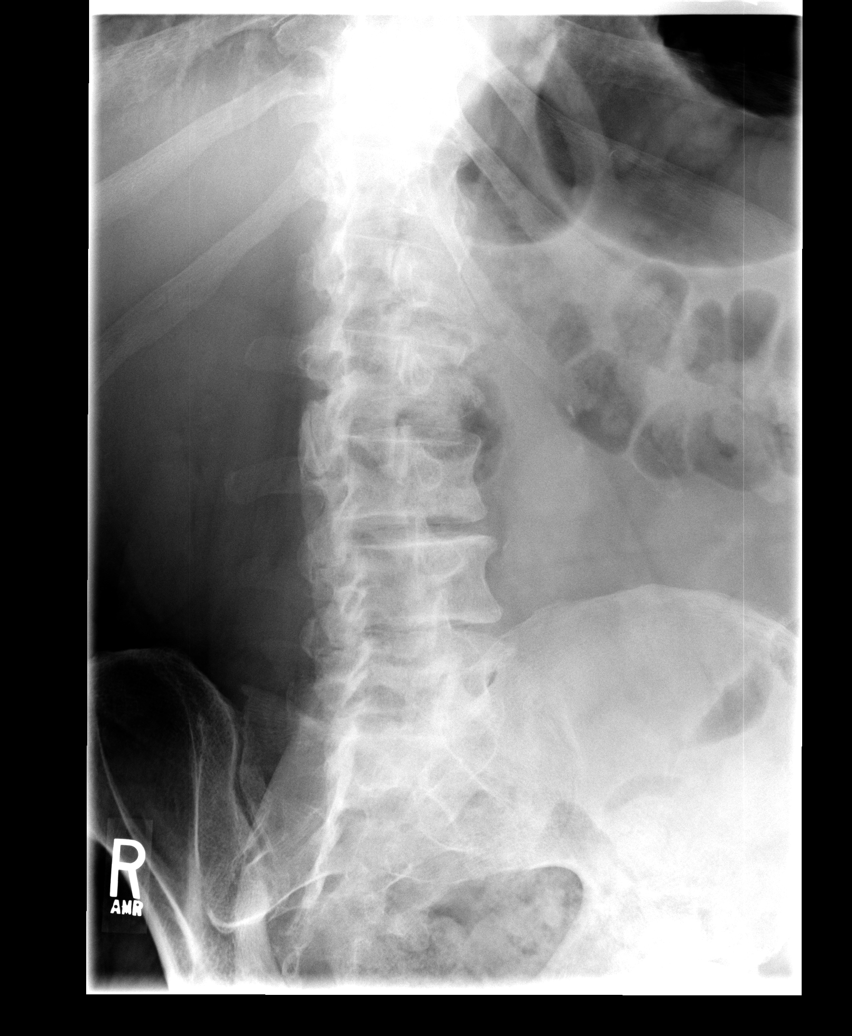

[view not recorded (4 of 6)]
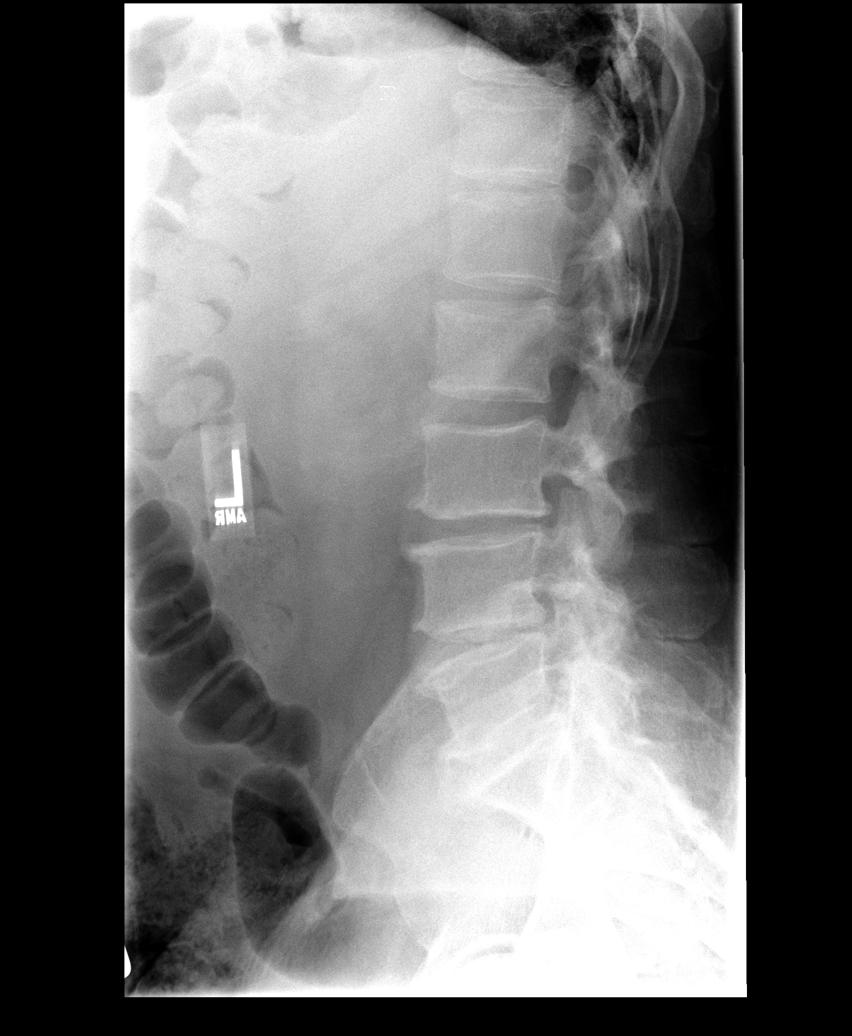

[view not recorded (5 of 6)]
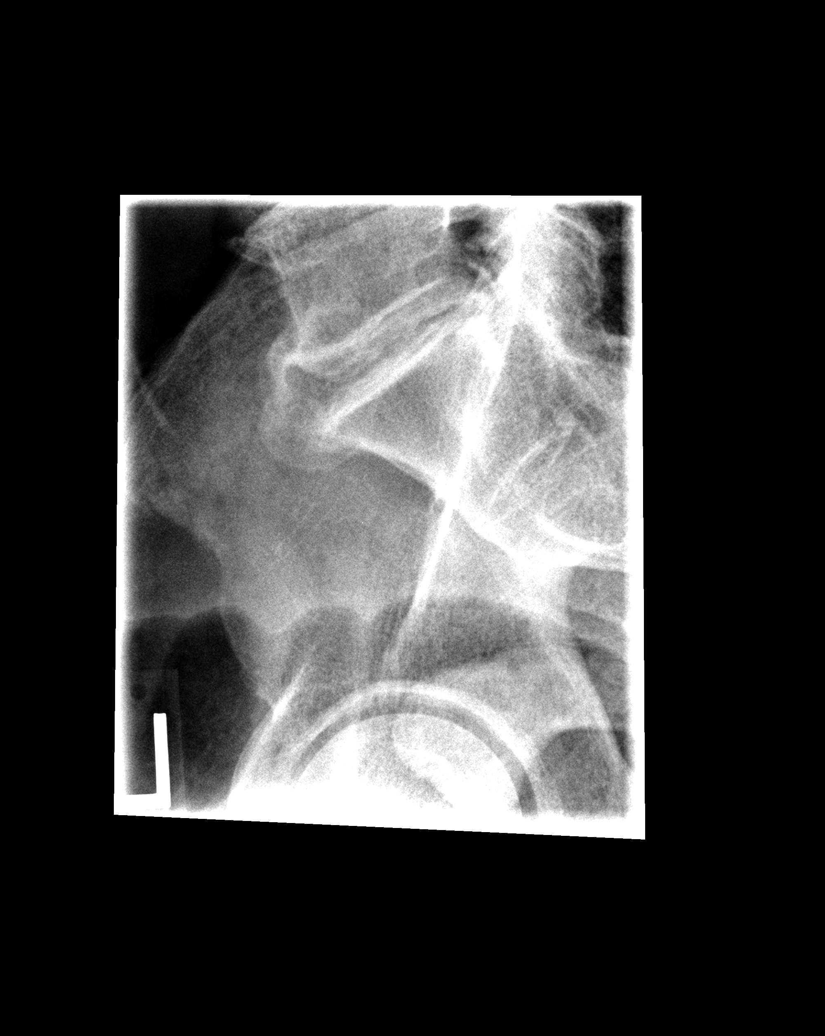

[view not recorded (6 of 6)]
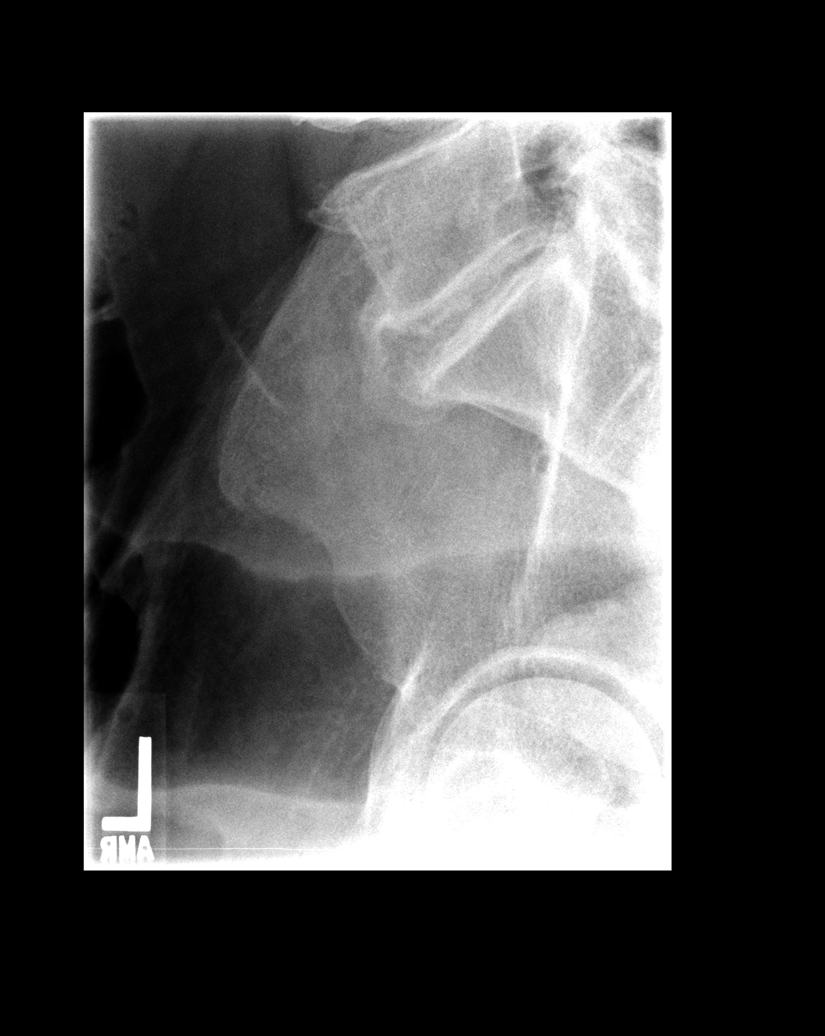

[6 of 6 positions shown; findings below may reference images not displayed]

FINDINGS: Facet arthropathy is noted at L4-5 and L5-S1 bilaterally,
with loss of intervertebral disc space particularly at L4-5 and L5-
S1.  Posterior interbody spurring is noted at L3-4 and L4-5, and
also L5-S1.

No lumbar malalignment is identified.

No fracture or acute findings are identified.
IMPRESSION: 1.  Lumbar spondylosis particularly in the lower lumbar spine,
without acute findings.

## 2011-01-08 ENCOUNTER — Ambulatory Visit (INDEPENDENT_AMBULATORY_CARE_PROVIDER_SITE_OTHER): Payer: Medicare Other | Admitting: Internal Medicine

## 2011-02-18 ENCOUNTER — Other Ambulatory Visit: Payer: Self-pay | Admitting: Cardiology

## 2011-02-19 ENCOUNTER — Ambulatory Visit (INDEPENDENT_AMBULATORY_CARE_PROVIDER_SITE_OTHER): Payer: Medicare Other | Admitting: Internal Medicine

## 2011-02-19 ENCOUNTER — Encounter (INDEPENDENT_AMBULATORY_CARE_PROVIDER_SITE_OTHER): Payer: Self-pay | Admitting: *Deleted

## 2011-03-31 ENCOUNTER — Other Ambulatory Visit: Payer: Self-pay | Admitting: Cardiology

## 2011-04-02 ENCOUNTER — Other Ambulatory Visit: Payer: Self-pay | Admitting: Cardiology

## 2011-04-10 ENCOUNTER — Other Ambulatory Visit: Payer: Self-pay | Admitting: Cardiology

## 2011-04-24 ENCOUNTER — Other Ambulatory Visit (HOSPITAL_COMMUNITY): Payer: Self-pay | Admitting: Pulmonary Disease

## 2011-04-26 ENCOUNTER — Ambulatory Visit (HOSPITAL_COMMUNITY): Payer: Medicare Other

## 2011-04-30 ENCOUNTER — Ambulatory Visit (HOSPITAL_COMMUNITY): Payer: Medicare Other

## 2011-05-03 ENCOUNTER — Other Ambulatory Visit: Payer: Self-pay | Admitting: Cardiology

## 2011-05-10 ENCOUNTER — Inpatient Hospital Stay (HOSPITAL_COMMUNITY): Admission: RE | Admit: 2011-05-10 | Payer: Medicare Other | Source: Ambulatory Visit

## 2011-05-10 ENCOUNTER — Ambulatory Visit (HOSPITAL_COMMUNITY): Payer: Medicare Other

## 2011-08-06 ENCOUNTER — Other Ambulatory Visit: Payer: Self-pay | Admitting: Cardiology

## 2011-08-16 ENCOUNTER — Other Ambulatory Visit: Payer: Self-pay | Admitting: Cardiology

## 2011-08-25 ENCOUNTER — Emergency Department (HOSPITAL_COMMUNITY)
Admission: EM | Admit: 2011-08-25 | Discharge: 2011-08-26 | Disposition: A | Discharge status: Home / Self Care | Payer: Medicare Other | Attending: Emergency Medicine | Admitting: Emergency Medicine

## 2011-08-25 ENCOUNTER — Emergency Department (HOSPITAL_COMMUNITY): Payer: Medicare Other

## 2011-08-25 DIAGNOSIS — E119 Type 2 diabetes mellitus without complications: Secondary | ICD-10-CM | POA: Insufficient documentation

## 2011-08-25 DIAGNOSIS — R609 Edema, unspecified: Secondary | ICD-10-CM | POA: Insufficient documentation

## 2011-08-25 DIAGNOSIS — I10 Essential (primary) hypertension: Secondary | ICD-10-CM | POA: Insufficient documentation

## 2011-08-25 DIAGNOSIS — R51 Headache: Secondary | ICD-10-CM | POA: Insufficient documentation

## 2011-08-25 DIAGNOSIS — M1990 Unspecified osteoarthritis, unspecified site: Secondary | ICD-10-CM | POA: Insufficient documentation

## 2011-08-25 DIAGNOSIS — R05 Cough: Secondary | ICD-10-CM | POA: Insufficient documentation

## 2011-08-25 DIAGNOSIS — R062 Wheezing: Secondary | ICD-10-CM | POA: Insufficient documentation

## 2011-08-25 DIAGNOSIS — Z79899 Other long term (current) drug therapy: Secondary | ICD-10-CM | POA: Insufficient documentation

## 2011-08-25 DIAGNOSIS — M7989 Other specified soft tissue disorders: Secondary | ICD-10-CM | POA: Insufficient documentation

## 2011-08-25 DIAGNOSIS — R079 Chest pain, unspecified: Secondary | ICD-10-CM | POA: Insufficient documentation

## 2011-08-25 DIAGNOSIS — K219 Gastro-esophageal reflux disease without esophagitis: Secondary | ICD-10-CM | POA: Insufficient documentation

## 2011-08-25 DIAGNOSIS — R0602 Shortness of breath: Secondary | ICD-10-CM | POA: Insufficient documentation

## 2011-08-25 DIAGNOSIS — E039 Hypothyroidism, unspecified: Secondary | ICD-10-CM | POA: Insufficient documentation

## 2011-08-25 DIAGNOSIS — I252 Old myocardial infarction: Secondary | ICD-10-CM | POA: Insufficient documentation

## 2011-08-25 DIAGNOSIS — R059 Cough, unspecified: Secondary | ICD-10-CM | POA: Insufficient documentation

## 2011-08-25 DIAGNOSIS — I2510 Atherosclerotic heart disease of native coronary artery without angina pectoris: Secondary | ICD-10-CM | POA: Insufficient documentation

## 2011-08-25 DIAGNOSIS — Z7982 Long term (current) use of aspirin: Secondary | ICD-10-CM | POA: Insufficient documentation

## 2011-08-25 LAB — DIFFERENTIAL
Band Neutrophils: 0 % (ref 0–10)
Blasts: 0 %
Metamyelocytes Relative: 0 %
Promyelocytes Absolute: 0 %
nRBC: 0 /100 WBC

## 2011-08-25 LAB — COMPREHENSIVE METABOLIC PANEL
ALT: 17 U/L (ref 0–53)
Alkaline Phosphatase: 83 U/L (ref 39–117)
BUN: 12 mg/dL (ref 6–23)
CO2: 33 mEq/L — ABNORMAL HIGH (ref 19–32)
Chloride: 98 mEq/L (ref 96–112)
GFR calc Af Amer: >90 mL/min (ref >90)
GFR calc non Af Amer: 83 mL/min — ABNORMAL LOW (ref >90)
Glucose, Bld: 99 mg/dL (ref 70–99)
Potassium: 3.3 mEq/L — ABNORMAL LOW (ref 3.5–5.1)
Sodium: 141 mEq/L (ref 135–145)
Total Bilirubin: 0.3 mg/dL (ref 0.3–1.2)

## 2011-08-25 LAB — POCT I-STAT TROPONIN I: Troponin i, poc: 0.01 ng/mL (ref 0.00–0.08)

## 2011-08-25 LAB — CBC
HCT: 34.8 % — ABNORMAL LOW (ref 39.0–52.0)
MCHC: 31.6 g/dL (ref 30.0–36.0)
MCV: 83.1 fL (ref 78.0–100.0)
Platelets: 245 10*3/uL (ref 150–400)
RDW: 15.4 % (ref 11.5–15.5)
WBC: 8.6 10*3/uL (ref 4.0–10.5)

## 2011-08-25 LAB — PRO B NATRIURETIC PEPTIDE: Pro B Natriuretic peptide (BNP): 337.1 pg/mL — ABNORMAL HIGH (ref 0–125)

## 2011-08-25 MED ORDER — ALBUTEROL SULFATE (5 MG/ML) 0.5% IN NEBU
5.0000 mg | INHALATION_SOLUTION | Freq: Once | RESPIRATORY_TRACT | Status: AC
  Administered 2011-08-25: 5 mg via RESPIRATORY_TRACT
  Filled 2011-08-25: qty 1

## 2011-08-25 MED ORDER — NITROGLYCERIN 2 % TD OINT
1.0000 [in_us] | TOPICAL_OINTMENT | Freq: Once | TRANSDERMAL | Status: AC
  Administered 2011-08-25: 1 [in_us] via TOPICAL
  Filled 2011-08-25: qty 1

## 2011-08-25 MED ORDER — ASPIRIN 81 MG PO CHEW
324.0000 mg | CHEWABLE_TABLET | Freq: Once | ORAL | Status: AC
  Administered 2011-08-25: 324 mg via ORAL
  Filled 2011-08-25: qty 4

## 2011-08-25 MED ORDER — IPRATROPIUM BROMIDE 0.02 % IN SOLN
0.5000 mg | Freq: Once | RESPIRATORY_TRACT | Status: AC
  Administered 2011-08-25: 0.5 mg via RESPIRATORY_TRACT
  Filled 2011-08-25: qty 2.5

## 2011-08-25 MED ORDER — FUROSEMIDE 10 MG/ML IJ SOLN
80.0000 mg | Freq: Once | INTRAMUSCULAR | Status: AC
  Administered 2011-08-25: 80 mg via INTRAVENOUS
  Filled 2011-08-25: qty 8

## 2011-08-25 MED ORDER — SODIUM CHLORIDE 0.9 % IV SOLN
Freq: Once | INTRAVENOUS | Status: AC
  Administered 2011-08-25: 10 mL/h via INTRAVENOUS

## 2011-08-25 NOTE — ED Provider Notes (Signed)
History     CSN: [id number]  Arrival date & time 08/25/11  1907   First MD Initiated Contact with Patient 08/25/11 1945      No chief complaint on file.   (Consider location/radiation/quality/duration/timing/severity/associated sxs/prior treatment) HPI  Patient has history of coronary artery disease. He states he started getting shortness of breath yesterday with dyspnea on exertion. He also had chest pain yesterday lasted about an hour and was relieved by taking 2 nitroglycerin. He cannot describe the pain. He states today about 2:30 PM he again started having central chest pain and took 2 nitroglycerin, and states the pain lasted about 30 minutes. He relates however when he exerts himself the pain returns. He also reports his headache dry cough for the past week with some sore throat and temperature up to 99.9. He states he's having wheezing which she's never had before. He has had swelling in his legs however he relates the swelling has been improving. He called Dr. Juanetta Gosling office and was prescribed Augmentin 3 days ago.  PCP Dr Juanetta Gosling Cardiologist Dr Daleen Squibb  Past Medical History  Diagnosis Date  . Diabetes mellitus   . Hypertension   . Osteoarthritis   . Barrett's esophagus   . Myocardial infarction   . Thyroid disease     hypothyroidism  . Sleep apnea   . GERD (gastroesophageal reflux disease)     Past Surgical History  Procedure Date  . Nissen fundoplication   . Carpal tunnel release     bilateral carpal tunnel release  . Back surgery   . Upper gastrointestinal endoscopy 01/28/08  . Upper gastrointestinal endoscopy 08/21/04   patient had stents placed March 2010 2 and the circumflex, March 2010 a stent with location not noted, in January 2011 he had 3 stents placed one in the R. PDA, mid LAD, and the distal mid LAD.  Family History  Problem Relation Age of Onset  . Adopted: Yes    History  Substance Use Topics  . Smoking status: Never Smoker   . Smokeless  tobacco: Never Used  . Alcohol Use: No  lives alone Secondhand smoke exposure   Review of Systems  All other systems reviewed and are negative.    Allergies  Review of patient's allergies indicates no known allergies.  Home Medications   Current Outpatient Rx  Name Route Sig Dispense Refill  . ALPRAZOLAM 0.5 MG PO TABS Oral Take 0.5 mg by mouth 3 (three) times daily.      Marland Kitchen AMLODIPINE BESYLATE 10 MG PO TABS  TAKE 1 TABLET EVERY DAY 30 tablet 9  . AMOXICILLIN-POT CLAVULANATE 875-125 MG PO TABS Oral Take 1 tablet by mouth 2 (two) times daily.    . ASPIRIN 325 MG PO TBEC Oral Take 325 mg by mouth daily.      Marland Kitchen DIOVAN 320 MG PO TABS  TAKE 1 TABLET EVERY DAY 30 tablet 3  . DOXAZOSIN MESYLATE 4 MG PO TABS  TAKE 1 TABLET AT BEDTIME 30 tablet 3  . KLOR-CON M10 10 MEQ PO TBCR  TAKE 2 TABLETS BY MOUTH TWICE A DAY 120 tablet 3  . METFORMIN HCL 1000 MG PO TABS Oral Take 1,000 mg by mouth 2 (two) times daily with a meal.      . METOPROLOL SUCCINATE ER 50 MG PO TB24  TAKE 1 TABLET EVERY DAY 30 tablet 6  . FIBER 7 PO Oral Take by mouth daily.      . MORPHINE SULFATE 60 MG PO TB12 Oral  Take 60 mg by mouth 2 (two) times daily.      Marland Kitchen NITROGLYCERIN 0.4 MG/SPRAY TL SOLN Sublingual Place 1 spray under the tongue every 5 (five) minutes as needed for chest pain. 12 g 3  . FISH OIL 1000 MG PO CAPS Oral Take 1 capsule by mouth daily.      . OXYCODONE-ACETAMINOPHEN 10-325 MG PO TABS Oral Take 1 tablet by mouth every 4 (four) hours as needed. pain    . POLYSACCHARIDE IRON 150 MG PO CAPS Oral Take 150 mg by mouth daily.      Marland Kitchen PREGABALIN 75 MG PO CAPS Oral Take 75 mg by mouth 2 (two) times daily.      . QUETIAPINE FUMARATE 50 MG PO TABS Oral Take 50 mg by mouth at bedtime.      . SERTRALINE HCL 50 MG PO TABS Oral Take 50 mg by mouth daily.    . TORSEMIDE 100 MG PO TABS Oral Take 50 mg by mouth 2 (two) times daily.     Marland Kitchen VITAMIN C 500 MG PO TABS Oral Take 500 mg by mouth daily.        BP 159/91  Pulse  71  Temp(Src) 98.8 F (37.1 C) (Oral)  Resp 20  Ht 5\' 11"  (1.803 m)  Wt 240 lb (108.863 kg)  BMI 33.47 kg/m2  SpO2 91%  Vital signs normal    Physical Exam  Nursing note and vitals reviewed. Constitutional: He is oriented to person, place, and time. He appears well-developed and well-nourished.  Non-toxic appearance. He does not appear ill. No distress.  HENT:  Head: Normocephalic and atraumatic.  Right Ear: External ear normal.  Left Ear: External ear normal.  Nose: Nose normal. No mucosal edema or rhinorrhea.  Mouth/Throat: Oropharynx is clear and moist and mucous membranes are normal. No dental abscesses or uvula swelling.  Eyes: Conjunctivae and EOM are normal. Pupils are equal, round, and reactive to light.  Neck: Normal range of motion and full passive range of motion without pain. Neck supple.  Cardiovascular: Normal rate, regular rhythm and normal heart sounds.  Exam reveals no gallop and no friction rub.   No murmur heard. Pulmonary/Chest: Effort normal. No respiratory distress. He has wheezes. He has no rhonchi. He has no rales. He exhibits no tenderness and no crepitus.       She is noted to have scattered wheezing in all lung fields. He does not have retractions  Abdominal: Soft. Normal appearance and bowel sounds are normal. He exhibits no distension. There is no tenderness. There is no rebound and no guarding.  Musculoskeletal: Normal range of motion. He exhibits edema. He exhibits no tenderness.       Moves all extremities well. Trace pitting edema of lower extremities  Neurological: He is alert and oriented to person, place, and time. He has normal strength. No cranial nerve deficit.  Skin: Skin is warm, dry and intact. No rash noted. No erythema. No pallor.  Psychiatric: He has a normal mood and affect. His speech is normal and behavior is normal. His mood appears not anxious.    ED Course  Procedures (including critical care time)   Medications  sertraline  (ZOLOFT) 50 MG tablet (not administered)  amoxicillin-clavulanate (AUGMENTIN) 875-125 MG per tablet (not administered)  predniSONE (DELTASONE) 10 MG tablet (not administered)  nitroGLYCERIN (NITROGLYN) 2 % ointment 1 inch (1 inch Topical Given 08/25/11 2123)  aspirin chewable tablet 324 mg (324 mg Oral Given 08/25/11 2123)  furosemide (LASIX)  injection 80 mg (80 mg Intravenous Given 08/25/11 2124)  albuterol (PROVENTIL) (5 MG/ML) 0.5% nebulizer solution 5 mg (5 mg Nebulization Given 08/25/11 2146)  ipratropium (ATROVENT) nebulizer solution 0.5 mg (0.5 mg Nebulization Given 08/25/11 2146)  0.9 %  sodium chloride infusion (10 mL/hr Intravenous New Bag/Given 08/25/11 2134)  albuterol (PROVENTIL) (5 MG/ML) 0.5% nebulizer solution 5 mg (5 mg Nebulization Given 08/25/11 2301)  ipratropium (ATROVENT) nebulizer solution 0.5 mg (0.5 mg Nebulization Given 08/25/11 2301)  predniSONE (DELTASONE) tablet 60 mg (60 mg Oral Given 08/26/11 0054)   2200 recheck after first nebulizer. Patient now has diffuse expiratory rhonchi and a few wheezing breath sounds. He relates he's feeling better. Patient given second nebulizer.  22:15 Pt ambulated in the ED and his pulse ox was 87-89 % on RA  23:10 turned over to Dr Colon Branch to determine disposition after third nebulizer  Results for orders placed during the hospital encounter of 08/25/11  CBC      Component Value Range   WBC 8.6  4.0 - 10.5 (K/uL)   RBC 4.19 (*) 4.22 - 5.81 (MIL/uL)   Hemoglobin 11.0 (*) 13.0 - 17.0 (g/dL)   HCT 45.4 (*) 09.8 - 52.0 (%)   MCV 83.1  78.0 - 100.0 (fL)   MCH 26.3  26.0 - 34.0 (pg)   MCHC 31.6  30.0 - 36.0 (g/dL)   RDW 11.9  14.7 - 82.9 (%)   Platelets 245  150 - 400 (K/uL)  DIFFERENTIAL      Component Value Range   Neutrophils Relative 57  43 - 77 (%)   Lymphocytes Relative 31  12 - 46 (%)   Monocytes Relative 6  3 - 12 (%)   Eosinophils Relative 6 (*) 0 - 5 (%)   Basophils Relative 0  0 - 1 (%)   Band Neutrophils 0  0 - 10 (%)    Metamyelocytes Relative 0     Myelocytes 0     Promyelocytes Absolute 0     Blasts 0     nRBC 0  0 (/100 WBC)   Neutro Abs 4.9  1.7 - 7.7 (K/uL)   Lymphs Abs 2.7  0.7 - 4.0 (K/uL)   Monocytes Absolute 0.5  0.1 - 1.0 (K/uL)   Eosinophils Absolute 0.5  0.0 - 0.7 (K/uL)   Basophils Absolute 0.0  0.0 - 0.1 (K/uL)  COMPREHENSIVE METABOLIC PANEL      Component Value Range   Sodium 141  135 - 145 (mEq/L)   Potassium 3.3 (*) 3.5 - 5.1 (mEq/L)   Chloride 98  96 - 112 (mEq/L)   CO2 33 (*) 19 - 32 (mEq/L)   Glucose, Bld 99  70 - 99 (mg/dL)   BUN 12  6 - 23 (mg/dL)   Creatinine, Ser 5.62  0.50 - 1.35 (mg/dL)   Calcium 9.3  8.4 - 13.0 (mg/dL)   Total Protein 7.9  6.0 - 8.3 (g/dL)   Albumin 4.1  3.5 - 5.2 (g/dL)   AST 19  0 - 37 (U/L)   ALT 17  0 - 53 (U/L)   Alkaline Phosphatase 83  39 - 117 (U/L)   Total Bilirubin 0.3  0.3 - 1.2 (mg/dL)   GFR calc non Af Amer 83 (*) >90 (mL/min)   GFR calc Af Amer >90  >90 (mL/min)  PRO B NATRIURETIC PEPTIDE      Component Value Range   Pro B Natriuretic peptide (BNP) 337.1 (*) 0 - 125 (pg/mL)  POCT I-STAT  TROPONIN I      Component Value Range   Troponin i, poc 0.00  0.00 - 0.08 (ng/mL)   Comment 3           POCT I-STAT TROPONIN I      Component Value Range   Troponin i, poc 0.01  0.00 - 0.08 (ng/mL)   Comment 3            Laboratory interpretation all normal except mild hypokalemia, minimally elevated BNP but is in significant, and mild anemia   Dg Chest Port 1 View  08/25/2011  *RADIOLOGY REPORT*  Clinical Data: Short of breath and wheezing  PORTABLE CHEST - 1 VIEW  Comparison: 07/12/2010  Findings: Moderate cardiomegaly.  Dense congestion.  No sign of interstitial edema.  No pneumothorax or pleural effusion.  Minimal basilar atelectasis.  IMPRESSION: Cardiomegaly without edema.  Minimal basilar atelectasis.  Original Report Authenticated By: Donavan Burnet, M.D.    Date: 08/25/2011  Rate: 80  Rhythm: atrial fibrillation  QRS Axis: normal   Intervals: QT prolonged  ST/T Wave abnormalities: nonspecific T wave changes  Conduction Disutrbances:none  Narrative Interpretation:   Old EKG Reviewed: changes noted from, 04/04/2009 was in NSR     1. Wheezing   2. Chest pain     Dispostion per Dr Arvella Nigh, MD, FACEP   MDM          Ward Givens, MD 08/26/11 [phone]

## 2011-08-25 NOTE — ED Notes (Signed)
Dyspnea with exertion - significant for past two days.  Chest pain today with NTG spray x 2 today and also used yesterday.  Thinks he may have "too much fluid"  Has cardiac history with total of 6 stents placed.

## 2011-08-26 ENCOUNTER — Encounter (HOSPITAL_COMMUNITY): Payer: Self-pay | Admitting: Emergency Medicine

## 2011-08-26 MED ORDER — PREDNISONE 10 MG PO TABS: 20.0000 mg | ORAL_TABLET | Freq: Every day | ORAL | Status: AC

## 2011-08-26 MED ORDER — PREDNISONE 20 MG PO TABS
60.0000 mg | ORAL_TABLET | Freq: Once | ORAL | Status: AC
  Administered 2011-08-26: 60 mg via ORAL
  Filled 2011-08-26: qty 3

## 2011-08-26 NOTE — Discharge Instructions (Signed)
Your EKG, heart numbers, chest x-ray were all normal here tonight. He responded well to the albuterol treatments. Take all of the prednisone. Use the nebulizer 4 times a day for the next 4 days then as needed for wheezing. Followup with your doctor.

## 2011-08-26 NOTE — ED Provider Notes (Signed)
2305 CM care disposition of this patient. Patient presented with chest pain and shortness of breath. He was wheezing on arrival. Currently awaiting second troponin. Reevaluation after second albuterol nebulizer treatment. First troponin is negative. Wheezing improved some first nebulizer treatment.  1610 patient ambulatory in the room. Feeling much for her. Wheezing has resolved. Occasional end expiratory wheeze. Second troponin is negative. Initiated steroid therapy.Pt stable in ED with no significant deterioration in condition.The patient appears reasonably screened and/or stabilized for discharge and I doubt any other medical condition or other [facility] requiring further screening, evaluation, or treatment in the ED at this time prior to discharge.  Nicoletta Dress. Colon Branch, MD 08/26/11 [phone]

## 2011-09-10 ENCOUNTER — Other Ambulatory Visit: Payer: Self-pay | Admitting: Cardiology

## 2011-09-17 ENCOUNTER — Ambulatory Visit: Payer: Medicare Other | Admitting: Cardiology

## 2011-09-23 ENCOUNTER — Encounter: Payer: Self-pay | Admitting: Cardiology

## 2011-09-23 ENCOUNTER — Ambulatory Visit (INDEPENDENT_AMBULATORY_CARE_PROVIDER_SITE_OTHER): Payer: Medicare Other | Admitting: Cardiology

## 2011-09-23 VITALS — BP 157/82 | HR 62 | Ht 71.0 in | Wt 252.0 lb

## 2011-09-23 DIAGNOSIS — G4730 Sleep apnea, unspecified: Secondary | ICD-10-CM

## 2011-09-23 DIAGNOSIS — E785 Hyperlipidemia, unspecified: Secondary | ICD-10-CM

## 2011-09-23 DIAGNOSIS — I2510 Atherosclerotic heart disease of native coronary artery without angina pectoris: Secondary | ICD-10-CM

## 2011-09-23 DIAGNOSIS — I10 Essential (primary) hypertension: Secondary | ICD-10-CM

## 2011-09-23 DIAGNOSIS — R609 Edema, unspecified: Secondary | ICD-10-CM

## 2011-09-23 DIAGNOSIS — I5032 Chronic diastolic (congestive) heart failure: Secondary | ICD-10-CM

## 2011-09-23 NOTE — Patient Instructions (Addendum)
**Note De-Identified  Obfuscation** Your physician recommends that you continue on your current medications as directed. Please refer to the Current Medication list given to you today.  Your physician recommends that you weigh, daily, at the same time every day, and in the same amount of clothing. Please record your daily weights on the handout provided and bring it to your next appointment.  If you gain 3 pounds in 3 to 4 days take additional 50 mg of Torsemide in the morning.  Your physician recommends that you schedule a follow-up appointment in: 3 months

## 2011-09-23 NOTE — Assessment & Plan Note (Signed)
Stable. No change in medical therapy. 

## 2011-09-23 NOTE — Assessment & Plan Note (Signed)
Maybe a little worse. See above management of diastolic heart failure.

## 2011-09-23 NOTE — Progress Notes (Signed)
HPI Johnny [name] comes in today for close followup of his chronic diastolic heart failure and nonobstructive coronary artery disease.  He brings all his meds with him and they're labeled how he should take them. He has some dyspnea on exertion no chest pain. He denies orthopnea. Swelling seems to be chronic.  He does not weigh himself every day.  Past Medical History  Diagnosis Date  . Diabetes mellitus   . Hypertension   . Osteoarthritis   . Barrett's esophagus   . Myocardial infarction   . Thyroid disease     hypothyroidism  . Sleep apnea   . GERD (gastroesophageal reflux disease)     Current Outpatient Prescriptions  Medication Sig Dispense Refill  . amLODipine (NORVASC) 10 MG tablet TAKE 1 TABLET EVERY DAY  30 tablet  9  . amoxicillin-clavulanate (AUGMENTIN) 875-125 MG per tablet Take 1 tablet by mouth 2 (two) times daily.      Marland Kitchen aspirin 325 MG EC tablet Take 325 mg by mouth daily.        Marland Kitchen DIOVAN 320 MG tablet TAKE 1 TABLET EVERY DAY  30 tablet  3  . doxazosin (CARDURA) 4 MG tablet TAKE 1 TABLET AT BEDTIME  30 tablet  2  . ibuprofen (ADVIL,MOTRIN) 200 MG tablet Take 200 mg by mouth every 6 (six) hours as needed.      Marland Kitchen KLOR-CON M10 10 MEQ tablet TAKE 2 TABLETS BY MOUTH TWICE A DAY  120 tablet  3  . metFORMIN (GLUCOPHAGE) 1000 MG tablet Take 1,000 mg by mouth 2 (two) times daily with a meal.        . metoprolol (TOPROL-XL) 50 MG 24 hr tablet TAKE 1 TABLET EVERY DAY  30 tablet  6  . NAPROXEN PO Take by mouth as needed.      . Omega-3 Fatty Acids (FISH OIL) 1000 MG CAPS Take 1 capsule by mouth daily.        . polysaccharide iron (NIFEREX) 150 MG CAPS capsule Take 150 mg by mouth daily.        . QUEtiapine (SEROQUEL) 50 MG tablet Take 50 mg by mouth at bedtime.        . sertraline (ZOLOFT) 50 MG tablet Take 50 mg by mouth daily.      Marland Kitchen torsemide (DEMADEX) 100 MG tablet Take 50 mg by mouth 2 (two) times daily.         No Known Allergies  Family History  Problem Relation Age of  Onset  . Adopted: Yes    History   Social History  . Marital Status: Married    Spouse Name: N/A    Number of Children: N/A  . Years of Education: N/A   Occupational History  . Not on file.   Social History Main Topics  . Smoking status: Never Smoker   . Smokeless tobacco: Never Used  . Alcohol Use: No  . Drug Use: No  . Sexually Active:    Other Topics Concern  . Not on file   Social History Narrative  . No narrative on file    ROS ALL NEGATIVE EXCEPT THOSE NOTED IN HPI  PE  General Appearance: well developed, well nourished in no acute distress, obese HEENT: symmetrical face, PERRLA,  Neck: no JVD, thyromegaly, or adenopathy, trachea midline Chest: symmetric without deformity Cardiac: PMI non-displaced, RRR, normal S1, S2, no gallop or murmur Lung: clear to ausculation and percussion Vascular: all pulses full without bruits  Abdominal: nondistended, nontender, good bowel  sounds, no HSM, no bruits Extremities: no cyanosis, clubbing, 2+ edema, no sign of DVT, no varicosities  Skin: normal color, no rashes Neuro: alert and oriented x 3, non-focal Pysch: normal affect  EKG  BMET    Component Value Date/Time   NA 141 08/25/2011 2000   K 3.3* 08/25/2011 2000   CL 98 08/25/2011 2000   CO2 33* 08/25/2011 2000   GLUCOSE 99 08/25/2011 2000   BUN 12 08/25/2011 2000   CREATININE 0.94 08/25/2011 2000   CREATININE 0.84 07/12/2010 1433   CALCIUM 9.3 08/25/2011 2000   GFRNONAA 83* 08/25/2011 2000   GFRAA >90 08/25/2011 2000    Lipid Panel     Component Value Date/Time   CHOL  Value: 98        ATP III CLASSIFICATION:  <200     mg/dL   Desirable  161-096  mg/dL   Borderline High  >=045    mg/dL   High        40/98/1191 0505   TRIG 100 03/30/2009 0505   HDL 22* 03/30/2009 0505   CHOLHDL 4.5 03/30/2009 0505   VLDL 20 03/30/2009 0505   LDLCALC  Value: 56        Total Cholesterol/HDL:CHD Risk Coronary Heart Disease Risk Table                     Men   Women  1/2 Average Risk    3.4   3.3  Average Risk       5.0   4.4  2 X Average Risk   9.6   7.1  3 X Average Risk  23.4   11.0        Use the calculated Patient Ratio above and the CHD Risk Table to determine the patient's CHD Risk.        ATP III CLASSIFICATION (LDL):  <100     mg/dL   Optimal  478-295  mg/dL   Near or Above                    Optimal  130-159  mg/dL   Borderline  621-308  mg/dL   High  >657     mg/dL   Very High 84/69/6295 0505    CBC    Component Value Date/Time   WBC 8.6 08/25/2011 2000   RBC 4.19* 08/25/2011 2000   HGB 11.0* 08/25/2011 2000   HCT 34.8* 08/25/2011 2000   PLT 245 08/25/2011 2000   MCV 83.1 08/25/2011 2000   MCH 26.3 08/25/2011 2000   MCHC 31.6 08/25/2011 2000   RDW 15.4 08/25/2011 2000   LYMPHSABS 2.7 08/25/2011 2000   MONOABS 0.5 08/25/2011 2000   EOSABS 0.5 08/25/2011 2000   BASOSABS 0.0 08/25/2011 2000

## 2011-09-23 NOTE — Assessment & Plan Note (Signed)
I've advised him weighing each day and writing it down. If he does not have scales we will findings on. If he gains 3 pounds or more he'll take an extra 50 mg of Demadex. We will see him back in 3 months for close followup. He seems to be very compliant during his meds the office and having things written down. He certainly seems to be trying.

## 2011-11-24 ENCOUNTER — Other Ambulatory Visit: Payer: Self-pay | Admitting: Cardiology

## 2011-12-16 ENCOUNTER — Other Ambulatory Visit: Payer: Self-pay | Admitting: Cardiology

## 2011-12-17 ENCOUNTER — Other Ambulatory Visit: Payer: Self-pay | Admitting: Cardiology

## 2011-12-24 ENCOUNTER — Other Ambulatory Visit: Payer: Self-pay | Admitting: Cardiology

## 2012-03-10 ENCOUNTER — Other Ambulatory Visit: Payer: Self-pay | Admitting: *Deleted

## 2012-03-10 MED ORDER — NITROGLYCERIN 0.4 MG SL SUBL: 0.4000 mg | SUBLINGUAL_TABLET | SUBLINGUAL | Status: DC | PRN

## 2012-03-12 ENCOUNTER — Telehealth: Payer: Self-pay | Admitting: Cardiology

## 2012-03-12 MED ORDER — NITROGLYCERIN 0.4 MG/SPRAY TL SOLN: 1.0000 | Status: DC | PRN

## 2012-03-12 NOTE — Telephone Encounter (Signed)
Noted pt currently listed on Nitro tablets, however pt requested spray that was on list in history, per pt request the spray was sent in via escribe

## 2012-03-12 NOTE — Telephone Encounter (Signed)
Patient needs NTG spray called to [facility]/ tg

## 2012-03-16 ENCOUNTER — Telehealth: Payer: Self-pay | Admitting: *Deleted

## 2012-03-16 MED ORDER — NITROGLYCERIN 0.4 MG/SPRAY TL SOLN: 1.0000 | Status: AC | PRN

## 2012-03-16 NOTE — Telephone Encounter (Signed)
Received incoming fax to advise pt is unable to take nitrostat tabs per mouth is too dry and is requesting to have nitrostat spray called in for him, rx sent in via escribe

## 2012-03-30 ENCOUNTER — Other Ambulatory Visit: Payer: Self-pay | Admitting: *Deleted

## 2012-03-30 MED ORDER — DOXAZOSIN MESYLATE 4 MG PO TABS: 4.0000 mg | ORAL_TABLET | Freq: Every day | ORAL | Status: DC

## 2012-04-14 ENCOUNTER — Other Ambulatory Visit: Payer: Self-pay | Admitting: *Deleted

## 2012-04-14 MED ORDER — POTASSIUM CHLORIDE CRYS ER 10 MEQ PO TBCR: 10.0000 meq | EXTENDED_RELEASE_TABLET | Freq: Two times a day (BID) | ORAL | Status: DC

## 2012-04-15 ENCOUNTER — Other Ambulatory Visit: Payer: Self-pay | Admitting: Cardiology

## 2012-04-16 ENCOUNTER — Other Ambulatory Visit: Payer: Self-pay | Admitting: *Deleted

## 2012-04-16 ENCOUNTER — Other Ambulatory Visit: Payer: Self-pay

## 2012-04-16 MED ORDER — POTASSIUM CHLORIDE CRYS ER 10 MEQ PO TBCR: 20.0000 meq | EXTENDED_RELEASE_TABLET | Freq: Two times a day (BID) | ORAL | Status: DC

## 2012-04-17 ENCOUNTER — Telehealth: Payer: Self-pay | Admitting: Cardiology

## 2012-04-17 NOTE — Telephone Encounter (Signed)
Called pt and he clarified that the bottle he picked up states TAKE ONE TABLET BY MOUTH 2 TIMES DAILY, advised pt that he needs to continue the correct dosage noted per MD TW notations on his last OV 6-13 TAKE TWO TABLETS-TWO TIMES DAILY, per notations have not been made in the chart that note a change to pt Klor-CON, apologized for inconvenience and confusion and pt noted he will use a permanent marker on his bottle to say take 2 tablets, 2 times daily, pt understood and noted upcoming apt with MD TW on 05-20-12 at 1:30pm, made change in chart to reflect correct dosage, pt understood all directions

## 2012-04-17 NOTE — Telephone Encounter (Signed)
Patient states that his RX was called in with different directions.  Please clarify dosage of Klor-Con with patient. / tgs

## 2012-05-20 ENCOUNTER — Ambulatory Visit (INDEPENDENT_AMBULATORY_CARE_PROVIDER_SITE_OTHER): Payer: Medicare Other | Admitting: Cardiology

## 2012-05-20 ENCOUNTER — Encounter: Payer: Self-pay | Admitting: Cardiology

## 2012-05-20 VITALS — BP 154/79 | HR 68 | Ht 71.0 in | Wt 265.0 lb

## 2012-05-20 DIAGNOSIS — I5032 Chronic diastolic (congestive) heart failure: Secondary | ICD-10-CM

## 2012-05-20 DIAGNOSIS — I2510 Atherosclerotic heart disease of native coronary artery without angina pectoris: Secondary | ICD-10-CM

## 2012-05-20 DIAGNOSIS — I219 Acute myocardial infarction, unspecified: Secondary | ICD-10-CM

## 2012-05-20 DIAGNOSIS — R609 Edema, unspecified: Secondary | ICD-10-CM

## 2012-05-20 DIAGNOSIS — I509 Heart failure, unspecified: Secondary | ICD-10-CM

## 2012-05-20 DIAGNOSIS — I10 Essential (primary) hypertension: Secondary | ICD-10-CM

## 2012-05-20 NOTE — Patient Instructions (Addendum)
Your physician recommends that you schedule a follow-up appointment in: ONE YEAR  Your physician recommends that you continue on your current medications as directed. Please refer to the Current Medication list given to you today.  

## 2012-05-20 NOTE — Assessment & Plan Note (Signed)
Stable. Continue secondary preventative therapy. 

## 2012-05-20 NOTE — Progress Notes (Signed)
HPI Johnny [name] returns today for evaluation and management of his history of coronary artery disease and chronic diastolic heart failure. He's had previous myocardial infarction the past as well.  He brings his pills with him today which is very helpful. His medication regimen is fairly complex. He is not taking ibuprofen or naproxen.  Other than shortness of breath with exertion, he has no complaints. He denies any angina or chest pain. He does have chronic edema but is on a fairly large dose of Demadex. He denies orthopnea or PND.  He seems to be fairly compliant with his medications.  Past Medical History  Diagnosis Date  . Diabetes mellitus   . Hypertension   . Osteoarthritis   . Barrett's esophagus   . Myocardial infarction   . Thyroid disease     hypothyroidism  . Sleep apnea   . GERD (gastroesophageal reflux disease)     Current Outpatient Prescriptions  Medication Sig Dispense Refill  . allopurinol (ZYLOPRIM) 300 MG tablet       . ALPRAZolam (XANAX) 0.5 MG tablet       . amoxicillin-clavulanate (AUGMENTIN) 875-125 MG per tablet Take 1 tablet by mouth 2 (two) times daily.      Marland Kitchen aspirin 325 MG EC tablet Take 325 mg by mouth daily.        Marland Kitchen DIOVAN 320 MG tablet TAKE 1 TABLET BY MOUTH EVERY DAY  30 tablet  1  . doxazosin (CARDURA) 4 MG tablet Take 1 tablet (4 mg total) by mouth at bedtime.  30 tablet  2  . ibuprofen (ADVIL,MOTRIN) 200 MG tablet Take 200 mg by mouth every 6 (six) hours as needed.      . metFORMIN (GLUCOPHAGE) 1000 MG tablet Take 1,000 mg by mouth 2 (two) times daily with a meal.        . metoprolol succinate (TOPROL-XL) 50 MG 24 hr tablet TAKE 1 TABLET EVERY DAY  30 tablet  6  . morphine (MS CONTIN) 60 MG 12 hr tablet       . NAPROXEN PO Take by mouth as needed.      . nitroGLYCERIN (NITROLINGUAL) 0.4 MG/SPRAY spray Place 1 spray under the tongue every 5 (five) minutes as needed for chest pain.  12 g  3  . Omega-3 Fatty Acids (FISH OIL) 1000 MG CAPS Take 1  capsule by mouth daily.        . polysaccharide iron (NIFEREX) 150 MG CAPS capsule Take 150 mg by mouth daily.        . potassium chloride (KLOR-CON M10) 10 MEQ tablet Take 2 tablets (20 mEq total) by mouth 2 (two) times daily.  120 tablet  5  . QUEtiapine (SEROQUEL) 50 MG tablet Take 50 mg by mouth at bedtime.        . sertraline (ZOLOFT) 50 MG tablet Take 50 mg by mouth daily.      Marland Kitchen torsemide (DEMADEX) 100 MG tablet Take 50 mg by mouth 2 (two) times daily.       . VOLTAREN 1 % GEL       . amLODipine (NORVASC) 10 MG tablet TAKE 1 TABLET EVERY DAY  30 tablet  9  . oxyCODONE-acetaminophen (PERCOCET) 10-325 MG per tablet        No current facility-administered medications for this visit.    No Known Allergies  Family History  Problem Relation Age of Onset  . Adopted: Yes    History   Social History  . Marital Status:  Married    Spouse Name: N/A    Number of Children: N/A  . Years of Education: N/A   Occupational History  . Not on file.   Social History Main Topics  . Smoking status: Never Smoker   . Smokeless tobacco: Never Used  . Alcohol Use: No  . Drug Use: No  . Sexually Active:    Other Topics Concern  . Not on file   Social History Narrative  . No narrative on file    ROS ALL NEGATIVE EXCEPT THOSE NOTED IN HPI  PE  General Appearance: well developed, well nourished in no acute distress, morbidly obese HEENT: symmetrical face, PERRLA,  Neck: no JVD, thyromegaly, or adenopathy, trachea midline Chest: symmetric without deformity Cardiac: PMI non-displaced, RRR, normal S1, S2, no gallop or murmur Lung: clear to ausculation and percussion Vascular: all pulses full without bruits  Abdominal: nondistended, nontender, good bowel sounds, no HSM, no bruits Extremities: no cyanosis, clubbing, 2+ pitting edema bilaterally, no sign of DVT, no varicosities  Skin: normal color, no rashes Neuro: alert and oriented x 3, non-focal Pysch: normal affect  EKG Normal  sinus rhythm with PACs, no acute changes.  BMET    Component Value Date/Time   NA 141 08/25/2011 2000   K 3.3* 08/25/2011 2000   CL 98 08/25/2011 2000   CO2 33* 08/25/2011 2000   GLUCOSE 99 08/25/2011 2000   BUN 12 08/25/2011 2000   CREATININE 0.94 08/25/2011 2000   CREATININE 0.84 07/12/2010 1433   CALCIUM 9.3 08/25/2011 2000   GFRNONAA 83* 08/25/2011 2000   GFRAA >90 08/25/2011 2000    Lipid Panel     Component Value Date/Time   CHOL  Value: 98        ATP III CLASSIFICATION:  <200     mg/dL   Desirable  161-096  mg/dL   Borderline High  >=045    mg/dL   High        40/98/1191 0505   TRIG 100 03/30/2009 0505   HDL 22* 03/30/2009 0505   CHOLHDL 4.5 03/30/2009 0505   VLDL 20 03/30/2009 0505   LDLCALC  Value: 56        Total Cholesterol/HDL:CHD Risk Coronary Heart Disease Risk Table                     Men   Women  1/2 Average Risk   3.4   3.3  Average Risk       5.0   4.4  2 X Average Risk   9.6   7.1  3 X Average Risk  23.4   11.0        Use the calculated Patient Ratio above and the CHD Risk Table to determine the patient's CHD Risk.        ATP III CLASSIFICATION (LDL):  <100     mg/dL   Optimal  478-295  mg/dL   Near or Above                    Optimal  130-159  mg/dL   Borderline  621-308  mg/dL   High  >657     mg/dL   Very High 84/69/6295 0505    CBC    Component Value Date/Time   WBC 8.6 08/25/2011 2000   RBC 4.19* 08/25/2011 2000   HGB 11.0* 08/25/2011 2000   HCT 34.8* 08/25/2011 2000   PLT 245 08/25/2011 2000   MCV 83.1 08/25/2011 2000  MCH 26.3 08/25/2011 2000   MCHC 31.6 08/25/2011 2000   RDW 15.4 08/25/2011 2000   LYMPHSABS 2.7 08/25/2011 2000   MONOABS 0.5 08/25/2011 2000   EOSABS 0.5 08/25/2011 2000   BASOSABS 0.0 08/25/2011 2000

## 2012-05-20 NOTE — Assessment & Plan Note (Signed)
Elevated today. He assures me he is not taking ibuprofen or naproxen. He is on 4 antihypertensive meds. We'll not add another. I do not want to increase his diuretic further either.

## 2012-05-20 NOTE — Assessment & Plan Note (Signed)
Relatively stable. Encouraged to lose weight and salt restrict. No changes made in his medications.

## 2012-06-11 ENCOUNTER — Other Ambulatory Visit: Payer: Self-pay | Admitting: Cardiology

## 2012-06-28 ENCOUNTER — Other Ambulatory Visit: Payer: Self-pay | Admitting: Cardiology

## 2012-06-29 NOTE — Telephone Encounter (Signed)
p 

## 2012-07-03 ENCOUNTER — Other Ambulatory Visit: Payer: Self-pay | Admitting: Cardiology

## 2012-09-30 ENCOUNTER — Other Ambulatory Visit: Payer: Self-pay | Admitting: Cardiology

## 2012-10-12 ENCOUNTER — Other Ambulatory Visit: Payer: Self-pay | Admitting: *Deleted

## 2012-10-12 MED ORDER — VALSARTAN 320 MG PO TABS: ORAL_TABLET | ORAL | Status: DC

## 2012-10-12 MED ORDER — POTASSIUM CHLORIDE CRYS ER 10 MEQ PO TBCR: 20.0000 meq | EXTENDED_RELEASE_TABLET | Freq: Two times a day (BID) | ORAL | Status: DC

## 2012-10-12 MED ORDER — AMLODIPINE BESYLATE 10 MG PO TABS: ORAL_TABLET | ORAL | Status: DC

## 2012-10-12 MED ORDER — DOXAZOSIN MESYLATE 4 MG PO TABS: ORAL_TABLET | ORAL | Status: DC

## 2012-10-12 MED ORDER — METOPROLOL SUCCINATE ER 50 MG PO TB24: ORAL_TABLET | ORAL | Status: DC

## 2012-11-24 ENCOUNTER — Ambulatory Visit (HOSPITAL_COMMUNITY)
Admission: RE | Admit: 2012-11-24 | Discharge: 2012-11-24 | Disposition: A | Discharge status: Home / Self Care | Payer: Medicare Other | Source: Ambulatory Visit | Attending: Pulmonary Disease | Admitting: Pulmonary Disease

## 2012-11-24 ENCOUNTER — Other Ambulatory Visit (HOSPITAL_COMMUNITY): Payer: Self-pay | Admitting: Pulmonary Disease

## 2012-11-24 DIAGNOSIS — I10 Essential (primary) hypertension: Secondary | ICD-10-CM | POA: Insufficient documentation

## 2012-11-24 DIAGNOSIS — J449 Chronic obstructive pulmonary disease, unspecified: Secondary | ICD-10-CM | POA: Insufficient documentation

## 2012-11-24 DIAGNOSIS — I517 Cardiomegaly: Secondary | ICD-10-CM

## 2012-11-24 DIAGNOSIS — R0989 Other specified symptoms and signs involving the circulatory and respiratory systems: Secondary | ICD-10-CM | POA: Insufficient documentation

## 2012-11-24 DIAGNOSIS — R0602 Shortness of breath: Secondary | ICD-10-CM

## 2012-11-24 DIAGNOSIS — R0609 Other forms of dyspnea: Secondary | ICD-10-CM | POA: Insufficient documentation

## 2012-11-24 DIAGNOSIS — R079 Chest pain, unspecified: Secondary | ICD-10-CM | POA: Insufficient documentation

## 2012-11-24 DIAGNOSIS — J4489 Other specified chronic obstructive pulmonary disease: Secondary | ICD-10-CM | POA: Insufficient documentation

## 2012-11-24 NOTE — Progress Notes (Signed)
*  PRELIMINARY RESULTS* Echocardiogram 2D Echocardiogram has been performed.  Johnny [name] 11/24/2012, 12:02 PM

## 2012-12-01 ENCOUNTER — Inpatient Hospital Stay (HOSPITAL_COMMUNITY)
Admission: EM | Admit: 2012-12-01 | Discharge: 2012-12-05 | DRG: 683 | Disposition: A | Discharge status: Home Health | Payer: Medicare Other | Attending: Pulmonary Disease | Admitting: Pulmonary Disease

## 2012-12-01 ENCOUNTER — Emergency Department (HOSPITAL_COMMUNITY): Payer: Medicare Other

## 2012-12-01 ENCOUNTER — Ambulatory Visit: Payer: Medicare Other | Admitting: Adult Health

## 2012-12-01 ENCOUNTER — Encounter (HOSPITAL_COMMUNITY): Payer: Self-pay | Admitting: *Deleted

## 2012-12-01 ENCOUNTER — Encounter: Payer: Self-pay | Admitting: Adult Health

## 2012-12-01 DIAGNOSIS — M159 Polyosteoarthritis, unspecified: Secondary | ICD-10-CM | POA: Diagnosis present

## 2012-12-01 DIAGNOSIS — Z9861 Coronary angioplasty status: Secondary | ICD-10-CM

## 2012-12-01 DIAGNOSIS — I10 Essential (primary) hypertension: Secondary | ICD-10-CM | POA: Diagnosis present

## 2012-12-01 DIAGNOSIS — F319 Bipolar disorder, unspecified: Secondary | ICD-10-CM | POA: Diagnosis present

## 2012-12-01 DIAGNOSIS — I5032 Chronic diastolic (congestive) heart failure: Secondary | ICD-10-CM | POA: Diagnosis present

## 2012-12-01 DIAGNOSIS — F0390 Unspecified dementia without behavioral disturbance: Secondary | ICD-10-CM | POA: Diagnosis present

## 2012-12-01 DIAGNOSIS — Z6833 Body mass index (BMI) 33.0-33.9, adult: Secondary | ICD-10-CM

## 2012-12-01 DIAGNOSIS — E119 Type 2 diabetes mellitus without complications: Secondary | ICD-10-CM | POA: Diagnosis present

## 2012-12-01 DIAGNOSIS — I429 Cardiomyopathy, unspecified: Secondary | ICD-10-CM | POA: Diagnosis present

## 2012-12-01 DIAGNOSIS — J449 Chronic obstructive pulmonary disease, unspecified: Secondary | ICD-10-CM | POA: Diagnosis present

## 2012-12-01 DIAGNOSIS — I509 Heart failure, unspecified: Secondary | ICD-10-CM | POA: Diagnosis present

## 2012-12-01 DIAGNOSIS — Z79899 Other long term (current) drug therapy: Secondary | ICD-10-CM

## 2012-12-01 DIAGNOSIS — E039 Hypothyroidism, unspecified: Secondary | ICD-10-CM | POA: Diagnosis present

## 2012-12-01 DIAGNOSIS — N179 Acute kidney failure, unspecified: Principal | ICD-10-CM | POA: Diagnosis present

## 2012-12-01 DIAGNOSIS — D649 Anemia, unspecified: Secondary | ICD-10-CM | POA: Diagnosis present

## 2012-12-01 DIAGNOSIS — R404 Transient alteration of awareness: Secondary | ICD-10-CM | POA: Diagnosis present

## 2012-12-01 DIAGNOSIS — Z87891 Personal history of nicotine dependence: Secondary | ICD-10-CM

## 2012-12-01 DIAGNOSIS — G4730 Sleep apnea, unspecified: Secondary | ICD-10-CM | POA: Diagnosis present

## 2012-12-01 DIAGNOSIS — J4489 Other specified chronic obstructive pulmonary disease: Secondary | ICD-10-CM | POA: Diagnosis present

## 2012-12-01 DIAGNOSIS — E870 Hyperosmolality and hypernatremia: Secondary | ICD-10-CM | POA: Diagnosis present

## 2012-12-01 DIAGNOSIS — E876 Hypokalemia: Secondary | ICD-10-CM | POA: Diagnosis present

## 2012-12-01 DIAGNOSIS — I2510 Atherosclerotic heart disease of native coronary artery without angina pectoris: Secondary | ICD-10-CM | POA: Diagnosis present

## 2012-12-01 DIAGNOSIS — I428 Other cardiomyopathies: Secondary | ICD-10-CM | POA: Diagnosis present

## 2012-12-01 DIAGNOSIS — K219 Gastro-esophageal reflux disease without esophagitis: Secondary | ICD-10-CM | POA: Diagnosis present

## 2012-12-01 DIAGNOSIS — N19 Unspecified kidney failure: Secondary | ICD-10-CM

## 2012-12-01 DIAGNOSIS — N189 Chronic kidney disease, unspecified: Secondary | ICD-10-CM | POA: Diagnosis present

## 2012-12-01 DIAGNOSIS — R4182 Altered mental status, unspecified: Secondary | ICD-10-CM | POA: Diagnosis present

## 2012-12-01 DIAGNOSIS — G8929 Other chronic pain: Secondary | ICD-10-CM | POA: Diagnosis present

## 2012-12-01 DIAGNOSIS — I129 Hypertensive chronic kidney disease with stage 1 through stage 4 chronic kidney disease, or unspecified chronic kidney disease: Secondary | ICD-10-CM | POA: Diagnosis present

## 2012-12-01 DIAGNOSIS — M479 Spondylosis, unspecified: Secondary | ICD-10-CM | POA: Diagnosis present

## 2012-12-01 DIAGNOSIS — M1710 Unilateral primary osteoarthritis, unspecified knee: Secondary | ICD-10-CM | POA: Diagnosis present

## 2012-12-01 LAB — URINALYSIS, ROUTINE W REFLEX MICROSCOPIC
Glucose, UA: NEGATIVE mg/dL
Ketones, ur: NEGATIVE mg/dL
Leukocytes, UA: NEGATIVE
Specific Gravity, Urine: 1.015 (ref 1.005–1.030)
pH: 5.5 (ref 5.0–8.0)

## 2012-12-01 LAB — CBC WITH DIFFERENTIAL/PLATELET
Basophils Absolute: 0 10*3/uL (ref 0.0–0.1)
Eosinophils Relative: 6 % — ABNORMAL HIGH (ref 0–5)
Lymphocytes Relative: 22 % (ref 12–46)
Neutro Abs: 5.9 10*3/uL (ref 1.7–7.7)
Platelets: 228 10*3/uL (ref 150–400)
RDW: 15.1 % (ref 11.5–15.5)
WBC: 9 10*3/uL (ref 4.0–10.5)

## 2012-12-01 LAB — RAPID URINE DRUG SCREEN, HOSP PERFORMED
Amphetamines: NOT DETECTED
Benzodiazepines: NOT DETECTED
Cocaine: NOT DETECTED
Opiates: POSITIVE — AB

## 2012-12-01 LAB — COMPREHENSIVE METABOLIC PANEL
ALT: 17 U/L (ref 0–53)
AST: 26 U/L (ref 0–37)
Albumin: 4.4 g/dL (ref 3.5–5.2)
Alkaline Phosphatase: 76 U/L (ref 39–117)
BUN: 112 mg/dL — ABNORMAL HIGH (ref 6–23)
CO2: 36 mEq/L — ABNORMAL HIGH (ref 19–32)
Calcium: 9.9 mg/dL (ref 8.4–10.5)
Chloride: 91 mEq/L — ABNORMAL LOW (ref 96–112)
Creatinine, Ser: 6.16 mg/dL — ABNORMAL HIGH (ref 0.50–1.35)
GFR calc Af Amer: 10 mL/min — ABNORMAL LOW (ref >90)
GFR calc non Af Amer: 8 mL/min — ABNORMAL LOW (ref >90)
Glucose, Bld: 144 mg/dL — ABNORMAL HIGH (ref 70–99)
Potassium: 3.6 mEq/L (ref 3.5–5.1)
Sodium: 138 mEq/L (ref 135–145)
Total Bilirubin: 0.3 mg/dL (ref 0.3–1.2)
Total Protein: 8.1 g/dL (ref 6.0–8.3)

## 2012-12-01 LAB — MRSA PCR SCREENING: MRSA by PCR: NEGATIVE

## 2012-12-01 LAB — ETHANOL: Alcohol, Ethyl (B): <11 mg/dL (ref 0–11)

## 2012-12-01 LAB — CREATININE, URINE, RANDOM: Creatinine, Urine: 54.36 mg/dL

## 2012-12-01 LAB — GLUCOSE, CAPILLARY

## 2012-12-01 LAB — APTT: aPTT: 28 seconds (ref 24–37)

## 2012-12-01 LAB — URINE MICROSCOPIC-ADD ON

## 2012-12-01 LAB — SODIUM, URINE, RANDOM: Sodium, Ur: 72 mEq/L

## 2012-12-01 MED ORDER — MORPHINE SULFATE 2 MG/ML IJ SOLN
2.0000 mg | INTRAMUSCULAR | Status: DC | PRN
  Administered 2012-12-02 (×2): 2 mg via INTRAVENOUS
  Filled 2012-12-01 (×2): qty 1

## 2012-12-01 MED ORDER — IPRATROPIUM BROMIDE 0.02 % IN SOLN
0.5000 mg | Freq: Four times a day (QID) | RESPIRATORY_TRACT | Status: DC
  Administered 2012-12-01 – 2012-12-05 (×12): 0.5 mg via RESPIRATORY_TRACT
  Filled 2012-12-01 (×12): qty 2.5

## 2012-12-01 MED ORDER — LORAZEPAM 2 MG/ML IJ SOLN
1.0000 mg | INTRAMUSCULAR | Status: DC | PRN
Start: 2012-12-01 — End: 2012-12-05
  Administered 2012-12-01 – 2012-12-02 (×6): 1 mg via INTRAVENOUS
  Filled 2012-12-01 (×6): qty 1

## 2012-12-01 MED ORDER — ENOXAPARIN SODIUM 30 MG/0.3ML ~~LOC~~ SOLN
30.0000 mg | SUBCUTANEOUS | Status: DC
  Administered 2012-12-01 – 2012-12-03 (×3): 30 mg via SUBCUTANEOUS
  Filled 2012-12-01 (×3): qty 0.3

## 2012-12-01 MED ORDER — OXYCODONE-ACETAMINOPHEN 10-325 MG PO TABS: 1.0000 | ORAL_TABLET | Freq: Three times a day (TID) | ORAL | Status: DC | PRN

## 2012-12-01 MED ORDER — ONDANSETRON HCL 4 MG PO TABS: 4.0000 mg | ORAL_TABLET | Freq: Four times a day (QID) | ORAL | Status: DC | PRN

## 2012-12-01 MED ORDER — SODIUM CHLORIDE 0.9 % IV SOLN
1000.0000 mL | INTRAVENOUS | Status: DC
  Administered 2012-12-01: 1000 mL via INTRAVENOUS

## 2012-12-01 MED ORDER — ACETAMINOPHEN 325 MG PO TABS: 650.0000 mg | ORAL_TABLET | Freq: Four times a day (QID) | ORAL | Status: DC | PRN

## 2012-12-01 MED ORDER — LEVOFLOXACIN IN D5W 250 MG/50ML IV SOLN
250.0000 mg | INTRAVENOUS | Status: DC
  Administered 2012-12-01 – 2012-12-03 (×3): 250 mg via INTRAVENOUS
  Filled 2012-12-01 (×6): qty 50

## 2012-12-01 MED ORDER — DOXAZOSIN MESYLATE 2 MG PO TABS
4.0000 mg | ORAL_TABLET | Freq: Every day | ORAL | Status: DC
  Administered 2012-12-01 – 2012-12-04 (×4): 4 mg via ORAL
  Filled 2012-12-01 (×4): qty 2

## 2012-12-01 MED ORDER — LORAZEPAM 2 MG/ML IJ SOLN
INTRAMUSCULAR | Status: AC
  Administered 2012-12-01: 1 mg via INTRAVENOUS
  Filled 2012-12-01: qty 1

## 2012-12-01 MED ORDER — OXYCODONE-ACETAMINOPHEN 5-325 MG PO TABS
1.0000 | ORAL_TABLET | Freq: Three times a day (TID) | ORAL | Status: DC | PRN
  Administered 2012-12-02 – 2012-12-05 (×3): 1 via ORAL
  Filled 2012-12-01 (×3): qty 1

## 2012-12-01 MED ORDER — OXYCODONE HCL 5 MG PO TABS
5.0000 mg | ORAL_TABLET | Freq: Three times a day (TID) | ORAL | Status: DC | PRN
  Administered 2012-12-03 – 2012-12-04 (×2): 5 mg via ORAL
  Filled 2012-12-01 (×2): qty 1

## 2012-12-01 MED ORDER — LEVOFLOXACIN IN D5W 250 MG/50ML IV SOLN
INTRAVENOUS | Status: AC
  Filled 2012-12-01: qty 50

## 2012-12-01 MED ORDER — ALPRAZOLAM 0.5 MG PO TABS
0.5000 mg | ORAL_TABLET | Freq: Three times a day (TID) | ORAL | Status: DC | PRN
  Administered 2012-12-03 – 2012-12-04 (×2): 0.5 mg via ORAL
  Filled 2012-12-01 (×4): qty 1

## 2012-12-01 MED ORDER — SERTRALINE HCL 50 MG PO TABS
50.0000 mg | ORAL_TABLET | Freq: Every day | ORAL | Status: DC
  Administered 2012-12-03 – 2012-12-05 (×3): 50 mg via ORAL
  Filled 2012-12-01 (×3): qty 1

## 2012-12-01 MED ORDER — NITROGLYCERIN 0.4 MG/SPRAY TL SOLN
1.0000 | Status: DC | PRN
  Filled 2012-12-01: qty 4.9

## 2012-12-01 MED ORDER — LORAZEPAM 2 MG/ML IJ SOLN
1.0000 mg | Freq: Once | INTRAMUSCULAR | Status: AC
  Administered 2012-12-01: 1 mg via INTRAVENOUS

## 2012-12-01 MED ORDER — METOPROLOL SUCCINATE ER 50 MG PO TB24
50.0000 mg | ORAL_TABLET | Freq: Every day | ORAL | Status: DC
  Administered 2012-12-01 – 2012-12-05 (×4): 50 mg via ORAL
  Filled 2012-12-01 (×4): qty 1

## 2012-12-01 MED ORDER — ALBUTEROL SULFATE (5 MG/ML) 0.5% IN NEBU
2.5000 mg | INHALATION_SOLUTION | Freq: Four times a day (QID) | RESPIRATORY_TRACT | Status: DC
  Administered 2012-12-01 – 2012-12-05 (×12): 2.5 mg via RESPIRATORY_TRACT
  Filled 2012-12-01 (×12): qty 0.5

## 2012-12-01 MED ORDER — SODIUM CHLORIDE 0.9 % IV SOLN
INTRAVENOUS | Status: DC
  Administered 2012-12-01 – 2012-12-02 (×3): via INTRAVENOUS

## 2012-12-01 MED ORDER — AMLODIPINE BESYLATE 5 MG PO TABS
10.0000 mg | ORAL_TABLET | Freq: Every day | ORAL | Status: DC
  Administered 2012-12-03 – 2012-12-05 (×3): 10 mg via ORAL
  Filled 2012-12-01: qty 1
  Filled 2012-12-01 (×3): qty 2

## 2012-12-01 MED ORDER — ACETAMINOPHEN 650 MG RE SUPP: 650.0000 mg | Freq: Four times a day (QID) | RECTAL | Status: DC | PRN

## 2012-12-01 MED ORDER — ONDANSETRON HCL 4 MG/2ML IJ SOLN: 4.0000 mg | Freq: Four times a day (QID) | INTRAMUSCULAR | Status: DC | PRN

## 2012-12-01 MED ORDER — SODIUM CHLORIDE 0.9 % IJ SOLN
3.0000 mL | Freq: Two times a day (BID) | INTRAMUSCULAR | Status: DC
  Administered 2012-12-03 – 2012-12-05 (×3): 3 mL via INTRAVENOUS

## 2012-12-01 MED ORDER — ALUM & MAG HYDROXIDE-SIMETH 200-200-20 MG/5ML PO SUSP: 30.0000 mL | Freq: Four times a day (QID) | ORAL | Status: DC | PRN

## 2012-12-01 NOTE — ED Notes (Addendum)
Patient with confusion, slurred speech, and altered LOC x 2 days. Patient arouses to voice. Pupils PERRL. Grips equal bilaterally. No pronator drift. Patient oriented to person, place, situation. Disoriented to time. Follows simple commands. Oxygen saturations noted to be 82 % on RA. H/o OSA, placed on 2 liters Atkins, oxygen improved to 93%

## 2012-12-01 NOTE — ED Provider Notes (Addendum)
CSN: [id number]     Arrival date & time 12/01/12  1058 History  This chart was scribed for Johnny Kras, MD, by Yevette Edwards, ED Scribe. This patient was seen in room APA10/APA10 and the patient's care was started at 11:39 AM. First MD Initiated Contact with Patient 12/01/12 1114     Chief Complaint  Patient presents with  . Altered Mental Status    The history is provided by the patient and a relative. No language interpreter was used.   HPI Comments: Johnny [name] is a 72 y.o. male, with a h/o HTN, who presents to the Emergency Department complaining of a gradual-onset,  gradually-worsening altered mental status which has been occurring for approximately three days.  The pt denies any pain. The pt's son reports the pt has had increased confusion, slurred speech, and pedal edema. The son reports incidences of confusion including that the pt has tried to use the remote for the phone, becomes disorientated when looking for a room in his house, and has attempted to place his pill bottle in a DVD player. The pt's son denies witnessing any weakness to one side of extremities. The son also reports that the pt has had similar symptoms previously when his medication was disrupted. The son denies any h/o stroke.   Past Medical History  Diagnosis Date  . Diabetes mellitus   . Hypertension   . Osteoarthritis   . Barrett's esophagus   . Myocardial infarction   . Thyroid disease     hypothyroidism  . Sleep apnea   . GERD (gastroesophageal reflux disease)    Past Surgical History  Procedure Laterality Date  . Nissen fundoplication    . Carpal tunnel release      bilateral carpal tunnel release  . Back surgery    . Upper gastrointestinal endoscopy  01/28/08  . Upper gastrointestinal endoscopy  08/21/04  . Coronary angioplasty with stent placement     Family History  Problem Relation Age of Onset  . Adopted: Yes   History  Substance Use Topics  . Smoking status: Never Smoker   .  Smokeless tobacco: Never Used  . Alcohol Use: No    Review of Systems A complete 10 system review of systems was obtained, and all systems were negative except where indicated in the HPI and PE.   Allergies  Review of patient's allergies indicates no known allergies.  Home Medications   Current Outpatient Rx  Name  Route  Sig  Dispense  Refill  . allopurinol (ZYLOPRIM) 300 MG tablet   Oral   Take 300 mg by mouth daily.          Marland Kitchen ALPRAZolam (XANAX) 0.5 MG tablet   Oral   Take 0.5 mg by mouth 3 (three) times daily as needed for anxiety.          Marland Kitchen amLODipine (NORVASC) 10 MG tablet   Oral   Take 10 mg by mouth daily.         Marland Kitchen aspirin 325 MG EC tablet   Oral   Take 325 mg by mouth daily.           Marland Kitchen doxazosin (CARDURA) 4 MG tablet   Oral   Take 4 mg by mouth at bedtime.         . metFORMIN (GLUCOPHAGE) 1000 MG tablet   Oral   Take 1,000 mg by mouth 2 (two) times daily with a meal.           .  metoprolol succinate (TOPROL-XL) 50 MG 24 hr tablet   Oral   Take 50 mg by mouth daily.         . nitroGLYCERIN (NITROLINGUAL) 0.4 MG/SPRAY spray   Sublingual   Place 1 spray under the tongue every 5 (five) minutes as needed for chest pain.   12 g   3   . Omega-3 Fatty Acids (FISH OIL) 1000 MG CAPS   Oral   Take 1 capsule by mouth daily.           Marland Kitchen oxyCODONE-acetaminophen (PERCOCET) 10-325 MG per tablet   Oral   Take 1 tablet by mouth every 8 (eight) hours as needed for pain.          . polysaccharide iron (NIFEREX) 150 MG CAPS capsule   Oral   Take 150 mg by mouth daily.           . potassium chloride (KLOR-CON M10) 10 MEQ tablet   Oral   Take 2 tablets (20 mEq total) by mouth 2 (two) times daily.   360 tablet   1   . QUEtiapine (SEROQUEL) 50 MG tablet   Oral   Take 50 mg by mouth at bedtime.           . sertraline (ZOLOFT) 50 MG tablet   Oral   Take 50 mg by mouth daily.         Marland Kitchen torsemide (DEMADEX) 100 MG tablet   Oral   Take  50 mg by mouth 2 (two) times daily.          . valsartan (DIOVAN) 320 MG tablet      TAKE 1 TABLET BY MOUTH EVERY DAY   90 tablet   1   . morphine (MS CONTIN) 60 MG 12 hr tablet   Oral   Take 60 mg by mouth 2 (two) times daily.           Triage Vitals: BP 139/55  Pulse 77  Temp(Src) 98.5 F (36.9 C) (Oral)  SpO2 91% Physical Exam  Nursing note and vitals reviewed. Constitutional: He is oriented to person, place, and time. He appears well-developed and well-nourished. No distress.  HENT:  Head: Normocephalic and atraumatic.  Right Ear: External ear normal.  Left Ear: External ear normal.  Mouth/Throat: Oropharynx is clear and moist.  Eyes: Conjunctivae are normal. Right eye exhibits no discharge. Left eye exhibits no discharge. No scleral icterus.  Neck: Neck supple. No tracheal deviation present.  Cardiovascular: Normal rate, regular rhythm and intact distal pulses.   Pulmonary/Chest: Effort normal and breath sounds normal. No stridor. No respiratory distress. He has no wheezes. He has no rales.  Abdominal: Soft. Bowel sounds are normal. He exhibits no distension. There is no tenderness. There is no rebound and no guarding.  Musculoskeletal: He exhibits edema. He exhibits no tenderness.  Neurological: He is alert and oriented to person, place, and time. No cranial nerve deficit ( no gross defecits noted) or sensory deficit. He exhibits normal muscle tone. He displays no seizure activity.  Generalized weakness in all four extremities.  Upper extremities: equal grip and strength.  Able to lift both hands off of the bed. Weakness  bilateral lower extremities, but able to lift each leg off bed a few inches. Movements are generally slowed.  Sensation intact in all extremities, no visual field cuts, no left or right sided neglect.  Skin: Skin is warm and dry. Rash (Lower extremities. No sign of infection. ) noted.  Psychiatric:  He has a normal mood and affect.    ED Course   Procedures (including critical care time)  DIAGNOSTIC STUDIES: Oxygen Saturation is 91% on room air, low by my interpretation.   EKG Atrial fibrillation rate 63 Nonspecific intraventricular conduction delay Normal ST T waves No significant change compared to prior EKG dated 08/25/2011 COORDINATION OF CARE:  11:44 AM- Discussed treatment plan with patient including lab work and a CT scan, and the patient, as well as his son, agreed to the plan.   1:11 PM- Rechecked pt.   I discussed the findings with the family and the patient. The patient is very upset that I think he needs to be in the hospital. Patient states he is ready to leave and does not want to be admitted. I am concerned however with his degree of uremia this is affecting his capacity to make an informed decision.  Family agrees the patient is being admitted to the hospital. I've spoken with Dr. Juanetta Gosling. The patient will  be admitted to the hospital.  CRITICAL CARE Performed by: Johnny [name] Total critical care time: 30 Critical care time was exclusive of separately billable procedures and treating other patients. Critical care was necessary to treat or prevent imminent or life-threatening deterioration. Critical care was time spent personally by me on the following activities: development of treatment plan with patient and/or surrogate as well as nursing, discussions with consultants, evaluation of patient's response to treatment, examination of patient, obtaining history from patient or surrogate, ordering and performing treatments and interventions, ordering and review of laboratory studies, ordering and review of radiographic studies, pulse oximetry and re-evaluation of patient's condition.   Labs Review Labs Reviewed  GLUCOSE, CAPILLARY - Abnormal; Notable for the following:    Glucose-Capillary 133 (*)    All other components within normal limits  CBC WITH DIFFERENTIAL - Abnormal; Notable for the following:    RBC 3.59  (*)    Hemoglobin 10.1 (*)    HCT 31.4 (*)    Eosinophils Relative 6 (*)    All other components within normal limits  COMPREHENSIVE METABOLIC PANEL - Abnormal; Notable for the following:    Chloride 91 (*)    CO2 36 (*)    Glucose, Bld 144 (*)    BUN 112 (*)    Creatinine, Ser 6.16 (*)    GFR calc non Af Amer 8 (*)    GFR calc Af Amer 10 (*)    All other components within normal limits  URINALYSIS, ROUTINE W REFLEX MICROSCOPIC - Abnormal; Notable for the following:    Hgb urine dipstick TRACE (*)    All other components within normal limits  URINE RAPID DRUG SCREEN (HOSP PERFORMED) - Abnormal; Notable for the following:    Opiates POSITIVE (*)    All other components within normal limits  APTT  PROTIME-INR  ETHANOL  URINE MICROSCOPIC-ADD ON   Imaging Review Dg Chest 1 View  12/01/2012   CLINICAL DATA:  Altered mental status, chest pain, shortness of Breath.  EXAM: CHEST - 1 VIEW  COMPARISON:  11/24/2012  FINDINGS: Cardiomegaly. Low lung volumes with bibasilar opacities, likely atelectasis. Mild vascular congestion. No overt edema. No effusions or acute bony abnormality.  IMPRESSION: Cardiomegaly with vascular congestion. Low volumes with bibasilar atelectasis.   Electronically Signed   By: Charlett Nose   On: 12/01/2012 12:27   Ct Head Wo Contrast  12/01/2012   *RADIOLOGY REPORT*  Clinical Data: Confusion.  Altered level of consciousness for 2 days.  CT HEAD WITHOUT CONTRAST  Technique:  Contiguous axial images were obtained from the base of the skull through the vertex without contrast.  Comparison: None.  Findings: There is some chronic microvascular ischemic change.  No evidence of acute abnormality including infarct, hemorrhage, mass lesion, mass effect, midline shift or abnormal extra-axial fluid collection is identified.  No hydrocephalus or pneumocephalus.  The calvarium is intact.  IMPRESSION: No acute finding.  Stable compared to prior exam.   Original Report Authenticated By:  Holley Dexter, M.D.    MDM   1. Acute renal failure   2. Uremia syndrome    Patient's confusion is related to his acute renal failure. The patient did urinate here in the emergency room. I have ordered a bladder scan to rule out urinary obstruction as an etiology.  I personally performed the services described in this documentation, which was scribed in my presence.  The recorded information has been reviewed and is accurate.   Johnny Kras, MD 12/01/12 1334  Johnny Kras, MD 12/01/12 [phone]

## 2012-12-01 NOTE — ED Notes (Signed)
Family reports increased confusion and difficulty with words x 2 days.

## 2012-12-01 NOTE — ED Notes (Signed)
Report given to Pam, RN.

## 2012-12-01 NOTE — Consult Note (Signed)
Reason for Consult: Acute kidney injury Referring Physician: [name] is an 72 y.o. male.  HPI: He is a patient who has history of diabetes, hypertension and diastolic dysfunction presently was brought by family because of increased confusion and altered mental status the last couple of days. Presently family members are not around hence most of the information is from his chart and also from the patient. Presently patient seems to be somewhat sleepy but arousable. He denies any nausea or vomiting. He denies any chest pain but he states that he has difficulty in breathing once in a while. Patient denies any previous history of kidney problem. According to his chart patient renal function was normal about a year ago. Presently his BUN and creatinine has increased significantly.  Past Medical History  Diagnosis Date  . Diabetes mellitus   . Hypertension   . Osteoarthritis   . Barrett's esophagus   . Myocardial infarction   . Thyroid disease     hypothyroidism  . Sleep apnea   . GERD (gastroesophageal reflux disease)     Past Surgical History  Procedure Laterality Date  . Nissen fundoplication    . Carpal tunnel release      bilateral carpal tunnel release  . Back surgery    . Upper gastrointestinal endoscopy  01/28/08  . Upper gastrointestinal endoscopy  08/21/04  . Coronary angioplasty with stent placement      Family History  Problem Relation Age of Onset  . Adopted: Yes    Social History:  reports that he has never smoked. He has never used smokeless tobacco. He reports that he does not drink alcohol or use illicit drugs.  Allergies: No Known Allergies  Medications: I have reviewed the patient's current medications.  Results for orders placed during the hospital encounter of 12/01/12 (from the past 48 hour(s))  GLUCOSE, CAPILLARY     Status: Abnormal   Collection Time    12/01/12 11:13 AM      Result Value Range   Glucose-Capillary 133 (*) 70 - 99  mg/dL   Comment 1 Documented in Chart     Comment 2 Notify RN    APTT     Status: None   Collection Time    12/01/12 11:47 AM      Result Value Range   aPTT 28  24 - 37 seconds  CBC WITH DIFFERENTIAL     Status: Abnormal   Collection Time    12/01/12 11:47 AM      Result Value Range   WBC 9.0  4.0 - 10.5 K/uL   RBC 3.59 (*) 4.22 - 5.81 MIL/uL   Hemoglobin 10.1 (*) 13.0 - 17.0 g/dL   HCT 81.1 (*) 91.4 - 78.2 %   MCV 87.5  78.0 - 100.0 fL   MCH 28.1  26.0 - 34.0 pg   MCHC 32.2  30.0 - 36.0 g/dL   RDW 95.6  21.3 - 08.6 %   Platelets 228  150 - 400 K/uL   Neutrophils Relative % 66  43 - 77 %   Neutro Abs 5.9  1.7 - 7.7 K/uL   Lymphocytes Relative 22  12 - 46 %   Lymphs Abs 2.0  0.7 - 4.0 K/uL   Monocytes Relative 7  3 - 12 %   Monocytes Absolute 0.6  0.1 - 1.0 K/uL   Eosinophils Relative 6 (*) 0 - 5 %   Eosinophils Absolute 0.5  0.0 - 0.7 K/uL  Basophils Relative 0  0 - 1 %   Basophils Absolute 0.0  0.0 - 0.1 K/uL  COMPREHENSIVE METABOLIC PANEL     Status: Abnormal   Collection Time    12/01/12 11:47 AM      Result Value Range   Sodium 138  135 - 145 mEq/L   Potassium 3.6  3.5 - 5.1 mEq/L   Chloride 91 (*) 96 - 112 mEq/L   CO2 36 (*) 19 - 32 mEq/L   Glucose, Bld 144 (*) 70 - 99 mg/dL   BUN 811 (*) 6 - 23 mg/dL   Creatinine, Ser 9.14 (*) 0.50 - 1.35 mg/dL   Calcium 9.9  8.4 - 78.2 mg/dL   Total Protein 8.1  6.0 - 8.3 g/dL   Albumin 4.4  3.5 - 5.2 g/dL   AST 26  0 - 37 U/L   ALT 17  0 - 53 U/L   Alkaline Phosphatase 76  39 - 117 U/L   Total Bilirubin 0.3  0.3 - 1.2 mg/dL   GFR calc non Af Amer 8 (*) >90 mL/min   GFR calc Af Amer 10 (*) >90 mL/min   Comment: (NOTE)     The eGFR has been calculated using the CKD EPI equation.     This calculation has not been validated in all clinical situations.     eGFR's persistently <90 mL/min signify possible Chronic Kidney     Disease.  PROTIME-INR     Status: None   Collection Time    12/01/12 11:47 AM      Result Value  Range   Prothrombin Time 14.0  11.6 - 15.2 seconds   INR 1.10  0.00 - 1.49  ETHANOL     Status: None   Collection Time    12/01/12 11:47 AM      Result Value Range   Alcohol, Ethyl (B) <11  0 - 11 mg/dL   Comment:            LOWEST DETECTABLE LIMIT FOR     SERUM ALCOHOL IS 11 mg/dL     FOR MEDICAL PURPOSES ONLY  URINALYSIS, ROUTINE W REFLEX MICROSCOPIC     Status: Abnormal   Collection Time    12/01/12  1:03 PM      Result Value Range   Color, Urine YELLOW  YELLOW   APPearance CLEAR  CLEAR   Specific Gravity, Urine 1.015  1.005 - 1.030   pH 5.5  5.0 - 8.0   Glucose, UA NEGATIVE  NEGATIVE mg/dL   Hgb urine dipstick TRACE (*) NEGATIVE   Bilirubin Urine NEGATIVE  NEGATIVE   Ketones, ur NEGATIVE  NEGATIVE mg/dL   Protein, ur NEGATIVE  NEGATIVE mg/dL   Urobilinogen, UA 0.2  0.0 - 1.0 mg/dL   Nitrite NEGATIVE  NEGATIVE   Leukocytes, UA NEGATIVE  NEGATIVE  URINE RAPID DRUG SCREEN (HOSP PERFORMED)     Status: Abnormal   Collection Time    12/01/12  1:03 PM      Result Value Range   Opiates POSITIVE (*) NONE DETECTED   Cocaine NONE DETECTED  NONE DETECTED   Benzodiazepines NONE DETECTED  NONE DETECTED   Amphetamines NONE DETECTED  NONE DETECTED   Tetrahydrocannabinol NONE DETECTED  NONE DETECTED   Barbiturates NONE DETECTED  NONE DETECTED   Comment:            DRUG SCREEN FOR MEDICAL PURPOSES     ONLY.  IF CONFIRMATION IS NEEDED  FOR ANY PURPOSE, NOTIFY LAB     WITHIN 5 DAYS.                LOWEST DETECTABLE LIMITS     FOR URINE DRUG SCREEN     Drug Class       Cutoff (ng/mL)     Amphetamine      1000     Barbiturate      200     Benzodiazepine   200     Tricyclics       300     Opiates          300     Cocaine          300     THC              50  URINE MICROSCOPIC-ADD ON     Status: None   Collection Time    12/01/12  1:03 PM      Result Value Range   WBC, UA 0-2  <3 WBC/hpf   Comment: 0-2   RBC / HPF 0-2  <3 RBC/hpf   Comment: 0-2  MRSA PCR SCREENING      Status: None   Collection Time    12/01/12  4:30 PM      Result Value Range   MRSA by PCR NEGATIVE  NEGATIVE   Comment:            The GeneXpert MRSA Assay (FDA     approved for NASAL specimens     only), is one component of a     comprehensive MRSA colonization     surveillance program. It is not     intended to diagnose MRSA     infection nor to guide or     monitor treatment for     MRSA infections.    Dg Chest 1 View  12/01/2012   CLINICAL DATA:  Altered mental status, chest pain, shortness of Breath.  EXAM: CHEST - 1 VIEW  COMPARISON:  11/24/2012  FINDINGS: Cardiomegaly. Low lung volumes with bibasilar opacities, likely atelectasis. Mild vascular congestion. No overt edema. No effusions or acute bony abnormality.  IMPRESSION: Cardiomegaly with vascular congestion. Low volumes with bibasilar atelectasis.   Electronically Signed   By: Charlett Nose   On: 12/01/2012 12:27   Ct Head Wo Contrast  12/01/2012   *RADIOLOGY REPORT*  Clinical Data: Confusion.  Altered level of consciousness for 2 days.  CT HEAD WITHOUT CONTRAST  Technique:  Contiguous axial images were obtained from the base of the skull through the vertex without contrast.  Comparison: None.  Findings: There is some chronic microvascular ischemic change.  No evidence of acute abnormality including infarct, hemorrhage, mass lesion, mass effect, midline shift or abnormal extra-axial fluid collection is identified.  No hydrocephalus or pneumocephalus.  The calvarium is intact.  IMPRESSION: No acute finding.  Stable compared to prior exam.   Original Report Authenticated By: Holley Dexter, M.D.    Review of Systems  Constitutional: Positive for malaise/fatigue.  Respiratory: Positive for shortness of breath.   Cardiovascular: Positive for leg swelling. Negative for chest pain.  Gastrointestinal: Negative for nausea and vomiting.   Blood pressure 147/112, pulse 73, temperature 98.5 F (36.9 C), temperature source Oral, resp.  rate 14, SpO2 92.00%. Physical Exam  Constitutional:  Patient sommolent but arrousable. Answer questions briefly and doses off.  Cardiovascular: Normal rate and regular rhythm.   GI: He exhibits no distension. There is no  tenderness.  Musculoskeletal: He exhibits no edema.  Skin: Skin is dry.    Assessment/Plan: Problem #1 acute kidney injury his pending creatinine is very high. The last blood work which was available to me was from 08/25/2011 and during that time his creatinine was 0.94. Hence the present increasing BUN and creatinine seems to be acute. The etiology could be prerenal syndrome as patient has been on large dose of Demadex. Since patient is also taking valsartan that might have also contributed to her etiology. At this moment having formations are not available. This also could be from ATN/acute interstitial nephritis as patient has some some eosinophilia/obstructive uropathy. Problem #2 history of hypertension his blood pressure seems to be reasonably controlled Problem #3 history of sleep apnea Problem #4 coronary disease status post stent placement he doesn't have any chest pain Problem #5 history of diabetes Problem #6 altered mental status. At this moment could be a combination of uremia and also the narcotic medication patient is on. Problem #7 history of hypothyroidism Problem #8 diastolic dysfunction presently doesn't have significant sign of fluid overload. Plan: I agree with hydration and will increase his IV fluid to 135 cc per hour. Patient will benefit from insertion of Foley catheter. We'll do ultrasound of the kidneys We'll check his fractional excretion of sodium We'll check urine for eosinophiluria  Sabrena Gavitt S 12/01/2012, 8:07 PM

## 2012-12-01 NOTE — ED Notes (Signed)
Patient agitated and wanting to leave. Refusing to let RN complete a bladder scan, refusing foley catheter. Patient states he wants to leave. MD advised. Orders obtained for agitation.

## 2012-12-02 ENCOUNTER — Inpatient Hospital Stay (HOSPITAL_COMMUNITY): Payer: Medicare Other

## 2012-12-02 LAB — COMPREHENSIVE METABOLIC PANEL
BUN: 93 mg/dL — ABNORMAL HIGH (ref 6–23)
CO2: 35 mEq/L — ABNORMAL HIGH (ref 19–32)
Calcium: 9.8 mg/dL (ref 8.4–10.5)
Chloride: 95 mEq/L — ABNORMAL LOW (ref 96–112)
Creatinine, Ser: 4.49 mg/dL — ABNORMAL HIGH (ref 0.50–1.35)
GFR calc Af Amer: 14 mL/min — ABNORMAL LOW (ref >90)
GFR calc non Af Amer: 12 mL/min — ABNORMAL LOW (ref >90)
Total Bilirubin: 0.4 mg/dL (ref 0.3–1.2)

## 2012-12-02 LAB — GLUCOSE, CAPILLARY
Glucose-Capillary: 141 mg/dL — ABNORMAL HIGH (ref 70–99)
Glucose-Capillary: 149 mg/dL — ABNORMAL HIGH (ref 70–99)
Glucose-Capillary: 164 mg/dL — ABNORMAL HIGH (ref 70–99)

## 2012-12-02 LAB — CBC
Hemoglobin: 10.2 g/dL — ABNORMAL LOW (ref 13.0–17.0)
MCH: 27.8 pg (ref 26.0–34.0)
Platelets: 230 10*3/uL (ref 150–400)
RBC: 3.67 MIL/uL — ABNORMAL LOW (ref 4.22–5.81)
WBC: 9.9 10*3/uL (ref 4.0–10.5)

## 2012-12-02 LAB — POTASSIUM: Potassium: 3.1 mEq/L — ABNORMAL LOW (ref 3.5–5.1)

## 2012-12-02 MED ORDER — HALOPERIDOL LACTATE 5 MG/ML IJ SOLN
INTRAMUSCULAR | Status: AC
  Filled 2012-12-02: qty 2

## 2012-12-02 MED ORDER — HALOPERIDOL LACTATE 5 MG/ML IJ SOLN
10.0000 mg | Freq: Once | INTRAMUSCULAR | Status: AC
  Administered 2012-12-02: 10 mg via INTRAMUSCULAR

## 2012-12-02 MED ORDER — GABAPENTIN 100 MG PO CAPS
200.0000 mg | ORAL_CAPSULE | Freq: Three times a day (TID) | ORAL | Status: DC
  Administered 2012-12-02 – 2012-12-05 (×8): 200 mg via ORAL
  Filled 2012-12-02 (×7): qty 2
  Filled 2012-12-02: qty 1

## 2012-12-02 MED ORDER — POTASSIUM CHLORIDE 10 MEQ/100ML IV SOLN
10.0000 meq | INTRAVENOUS | Status: AC
  Administered 2012-12-02 – 2012-12-03 (×4): 10 meq via INTRAVENOUS
  Filled 2012-12-02: qty 400

## 2012-12-02 MED ORDER — HALOPERIDOL LACTATE 5 MG/ML IJ SOLN
10.0000 mg | Freq: Once | INTRAMUSCULAR | Status: AC
  Administered 2012-12-02: 10 mg via INTRAVENOUS

## 2012-12-02 MED ORDER — HALOPERIDOL LACTATE 5 MG/ML IJ SOLN
5.0000 mg | Freq: Four times a day (QID) | INTRAMUSCULAR | Status: DC | PRN
  Administered 2012-12-02: 5 mg via INTRAVENOUS
  Filled 2012-12-02: qty 1

## 2012-12-02 NOTE — Progress Notes (Signed)
Johnny [name]  MRN: [id number]  DOB/AGE: Jun 15, 1940 72 y.o.  Primary Care Physician:HAWKINS,EDWARD L, MD  Admit date: 12/01/2012  Chief Complaint:  Chief Complaint  Patient presents with  . Altered Mental Status    S-Pt presented on  12/01/2012 with  Chief Complaint  Patient presents with  . Altered Mental Status  .    Pt resting comfortable.   Pt was agitated last night.  Meds  . albuterol  2.5 mg Nebulization Q6H  . amLODipine  10 mg Oral Daily  . doxazosin  4 mg Oral QHS  . enoxaparin (LOVENOX) injection  30 mg Subcutaneous Q24H  . gabapentin  200 mg Oral TID  . ipratropium  0.5 mg Nebulization Q6H  . levofloxacin (LEVAQUIN) IV  250 mg Intravenous Q24H  . metoprolol succinate  50 mg Oral Daily  . sertraline  50 mg Oral Daily  . sodium chloride  3 mL Intravenous Q12H     Physical Exam: Vital signs in last 24 hours: Temp:  [97.9 F (36.6 C)-99.2 F (37.3 C)] 99.2 F (37.3 C) (09/03 0730) Pulse Rate:  [58-95] 73 (09/03 0800) Resp:  [14-18] 17 (09/03 0600) BP: (86-147)/(58-112) 132/64 mmHg (09/03 0800) SpO2:  [92 %-97 %] 96 % (09/03 0811) FiO2 (%):  [28 %] 28 % (09/02 1933) Weight:  [233 lb 11 oz (106 kg)] 233 lb 11 oz (106 kg) (09/03 0500) Weight change:  Last BM Date: 11/29/12  Intake/Output from previous day: 09/02 0701 - 09/03 0700 In: 320 [P.O.:320] Out: 2410 [Urine:2410]     Physical Exam: General- pt is lethargic, confused. Resp- No acute REsp distress, CTA B/L NO Rhonchi CVS- S1S2 regular in rate and rhythm GIT- BS+, soft, NT, ND EXT- NO LE Edema, Cyanosis   Lab Results: CBC  Recent Labs  12/01/12 1147 12/02/12 0519  WBC 9.0 9.9  HGB 10.1* 10.2*  HCT 31.4* 31.9*  PLT 228 230    BMET  Recent Labs  12/01/12 1147 12/02/12 0519  NA 138 144  K 3.6 3.1*  CL 91* 95*  CO2 36* 35*  GLUCOSE 144* 171*  BUN 112* 93*  CREATININE 6.16* 4.49*  CALCIUM 9.9 9.8   Creat Trend 2014   6.16==>4.49 2013   0.94  MICRO Recent Results  (from the past 240 hour(s))  MRSA PCR SCREENING     Status: None   Collection Time    12/01/12  4:30 PM      Result Value Range Status   MRSA by PCR NEGATIVE  NEGATIVE Final   Comment:            The GeneXpert MRSA Assay (FDA     approved for NASAL specimens     only), is one component of a     comprehensive MRSA colonization     surveillance program. It is not     intended to diagnose MRSA     infection nor to guide or     monitor treatment for     MRSA infections.      Lab Results  Component Value Date   CALCIUM 9.8 12/02/2012   CAION 1.12 09/28/2008            Impression: 1)Renal  AKI secondary to Prerenal/ATN/Post renal AKI sec to Hypovolemia with ARB  Pt Urine output mch better after foley placement  AKi much better  2)HTN Bp stable  Medication- On Calcium Channel Blockers On Beta blockers   3)Anemia HGb at goal (9--11)   4)CAD Hx  of CAD Pt is stablel.  5)CNS Admitted with AMS  6)Electrolytes Hypokalemic NOrmonatremic   7)Acid base Co2 at goal     Plan:  Will replete k  Will continue current tx      Kalis Friese S 12/02/2012, 12:23 PM

## 2012-12-02 NOTE — Progress Notes (Signed)
Subjective: He was very agitated last night. He has no new complaints but is confused and disoriented. His family says he's been having symptoms of dementia for the last few years and I suspect that part of the problem is ICU psychosis. His renal function has improved.  Objective: Vital signs in last 24 hours: Temp:  [97.9 F (36.6 C)-98.5 F (36.9 C)] 97.9 F (36.6 C) (09/03 0400) Pulse Rate:  [53-95] 58 (09/03 0200) Resp:  [14-18] 18 (09/03 0400) BP: (86-147)/(54-112) 86/58 mmHg (09/03 0400) SpO2:  [82 %-96 %] 92 % (09/03 0200) FiO2 (%):  [28 %] 28 % (09/02 1933) Weight:  [106 kg (233 lb 11 oz)] 106 kg (233 lb 11 oz) (09/03 0500) Weight change:  Last BM Date: 11/29/12  Intake/Output from previous day: 09/02 0701 - 09/03 0700 In: 320 [P.O.:320] Out: 2410 [Urine:2410]  PHYSICAL EXAM General appearance: alert, moderate distress, morbidly obese and Agitated Resp: clear to auscultation bilaterally Cardio: regular rate and rhythm, S1, S2 normal, no murmur, click, rub or gallop GI: soft, non-tender; bowel sounds normal; no masses,  no organomegaly Extremities: extremities normal, atraumatic, no cyanosis or edema  Lab Results:    Basic Metabolic Panel:  Recent Labs  16/10/96 1147 12/02/12 0519  NA 138 144  K 3.6 3.1*  CL 91* 95*  CO2 36* 35*  GLUCOSE 144* 171*  BUN 112* 93*  CREATININE 6.16* 4.49*  CALCIUM 9.9 9.8   Liver Function Tests:  Recent Labs  12/01/12 1147 12/02/12 0519  AST 26 29  ALT 17 18  ALKPHOS 76 76  BILITOT 0.3 0.4  PROT 8.1 8.0  ALBUMIN 4.4 4.3   No results found for this basename: LIPASE, AMYLASE,  in the last 72 hours No results found for this basename: AMMONIA,  in the last 72 hours CBC:  Recent Labs  12/01/12 1147 12/02/12 0519  WBC 9.0 9.9  NEUTROABS 5.9  --   HGB 10.1* 10.2*  HCT 31.4* 31.9*  MCV 87.5 86.9  PLT 228 230   Cardiac Enzymes: No results found for this basename: CKTOTAL, CKMB, CKMBINDEX, TROPONINI,  in the last  72 hours BNP: No results found for this basename: PROBNP,  in the last 72 hours D-Dimer: No results found for this basename: DDIMER,  in the last 72 hours CBG:  Recent Labs  12/01/12 1113  GLUCAP 133*   Hemoglobin A1C: No results found for this basename: HGBA1C,  in the last 72 hours Fasting Lipid Panel: No results found for this basename: CHOL, HDL, LDLCALC, TRIG, CHOLHDL, LDLDIRECT,  in the last 72 hours Thyroid Function Tests: No results found for this basename: TSH, T4TOTAL, FREET4, T3FREE, THYROIDAB,  in the last 72 hours Anemia Panel: No results found for this basename: VITAMINB12, FOLATE, FERRITIN, TIBC, IRON, RETICCTPCT,  in the last 72 hours Coagulation:  Recent Labs  12/01/12 1147  LABPROT 14.0  INR 1.10   Urine Drug Screen: Drugs of Abuse     Component Value Date/Time   LABOPIA POSITIVE* 12/01/2012 1303   COCAINSCRNUR NONE DETECTED 12/01/2012 1303   LABBENZ NONE DETECTED 12/01/2012 1303   AMPHETMU NONE DETECTED 12/01/2012 1303   THCU NONE DETECTED 12/01/2012 1303   LABBARB NONE DETECTED 12/01/2012 1303    Alcohol Level:  Recent Labs  12/01/12 1147  ETH <11   Urinalysis:  Recent Labs  12/01/12 1303  COLORURINE YELLOW  LABSPEC 1.015  PHURINE 5.5  GLUCOSEU NEGATIVE  HGBUR TRACE*  BILIRUBINUR NEGATIVE  KETONESUR NEGATIVE  PROTEINUR NEGATIVE  UROBILINOGEN 0.2  NITRITE NEGATIVE  LEUKOCYTESUR NEGATIVE   Misc. Labs:  ABGS No results found for this basename: PHART, PCO2, PO2ART, TCO2, HCO3,  in the last 72 hours CULTURES Recent Results (from the past 240 hour(s))  MRSA PCR SCREENING     Status: None   Collection Time    12/01/12  4:30 PM      Result Value Range Status   MRSA by PCR NEGATIVE  NEGATIVE Final   Comment:            The GeneXpert MRSA Assay (FDA     approved for NASAL specimens     only), is one component of a     comprehensive MRSA colonization     surveillance program. It is not     intended to diagnose MRSA     infection nor to  guide or     monitor treatment for     MRSA infections.   Studies/Results: Dg Chest 1 View  12/01/2012   CLINICAL DATA:  Altered mental status, chest pain, shortness of Breath.  EXAM: CHEST - 1 VIEW  COMPARISON:  11/24/2012  FINDINGS: Cardiomegaly. Low lung volumes with bibasilar opacities, likely atelectasis. Mild vascular congestion. No overt edema. No effusions or acute bony abnormality.  IMPRESSION: Cardiomegaly with vascular congestion. Low volumes with bibasilar atelectasis.   Electronically Signed   By: Charlett Nose   On: 12/01/2012 12:27   Ct Head Wo Contrast  12/01/2012   *RADIOLOGY REPORT*  Clinical Data: Confusion.  Altered level of consciousness for 2 days.  CT HEAD WITHOUT CONTRAST  Technique:  Contiguous axial images were obtained from the base of the skull through the vertex without contrast.  Comparison: None.  Findings: There is some chronic microvascular ischemic change.  No evidence of acute abnormality including infarct, hemorrhage, mass lesion, mass effect, midline shift or abnormal extra-axial fluid collection is identified.  No hydrocephalus or pneumocephalus.  The calvarium is intact.  IMPRESSION: No acute finding.  Stable compared to prior exam.   Original Report Authenticated By: Holley Dexter, M.D.    Medications:  Scheduled: . albuterol  2.5 mg Nebulization Q6H  . amLODipine  10 mg Oral Daily  . doxazosin  4 mg Oral QHS  . enoxaparin (LOVENOX) injection  30 mg Subcutaneous Q24H  . haloperidol lactate      . haloperidol lactate      . ipratropium  0.5 mg Nebulization Q6H  . levofloxacin (LEVAQUIN) IV  250 mg Intravenous Q24H  . metoprolol succinate  50 mg Oral Daily  . sertraline  50 mg Oral Daily  . sodium chloride  3 mL Intravenous Q12H   Continuous: . sodium chloride 125 mL/hr at 12/01/12 2042  . sodium chloride 1,000 mL (12/01/12 2358)   ION:[id number], acetaminophen, ALPRAZolam, alum & mag hydroxide-simeth, LORazepam, morphine injection,  nitroGLYCERIN, ondansetron (ZOFRAN) IV, ondansetron, oxyCODONE, oxyCODONE-acetaminophen  Assesment: He has acute on chronic renal failure. He has delirium likely metabolic. He has some evidence of dementia which is made that delirium worse. He has coronary artery occlusive disease but no chest pain. He has had episodes of congestive heart failure but does not appear to be having acute CHF now. He has COPD. He has hypertension which is been difficult to control. He has chronic musculoskeletal pain and has been on chronic narcotic treatment for that. He has a history of bipolar disease in the past had been on an antipsychotic but has been off that for some time Active Problems:   *  No active hospital problems. *    Plan: Continue fluids. I'm going to add gabapentin and see if it makes any difference with his agitation. He seems to have response is somewhat to morphine so he may be having some withdrawal from his chronic narcotics.    LOS: 1 day   Vesper Trant L 12/02/2012, 7:37 AM

## 2012-12-02 NOTE — H&P (Signed)
NAME:  Johnny [name], Johnny [name] NO.:  [id number]  MEDICAL RECORD NO.:  [id number]  LOCATION:  [facility]                         FACILITY:  [facility]  PHYSICIAN:  Ameri Cahoon L. Juanetta Gosling, M.D.DATE OF BIRTH:  28-Feb-1941  DATE OF ADMISSION:  12/01/2012 DATE OF DISCHARGE:  LH                             HISTORY & PHYSICAL   REASON FOR ADMISSION:  Acute renal failure and altered mental status.  HISTORY:  Johnny [name] is a 72 year old, who has a long known history of multiple medical problems including COPD, coronary artery occlusive disease, congestive heart failure, diabetes, mild chronic renal failure, bipolar disease, hypertension.  He was in my office about a week ago with increasing edema of his legs.  Since that time, his renal function was about at baseline.  He has been taking his diuretics and added Zaroxolyn to his diuretics for 5 days.  He took that and appeared to be doing better with that swelling but then at about 72 hours prior to admission, he became less responsive there, apparently went about 34-36 hours without getting out of bed or eating or drinking.  He took some of his medications, but it is not clear what he took.  He continued being very sleepy and has more problems according to family and eventually they convinced him to come to the emergency room.  He was previously adamant but he would not found.  When he got into the emergency room, he was noted to be in acute renal failure.  PAST MEDICAL HISTORY:  As mentioned is positive for COPD, coronary artery occlusive disease, congestive heart failure, bipolar disease, diabetes.  He has had severe problems with his back and knee with osteoarthritis.  SOCIAL HISTORY:  His wife died about a year ago.  He has been having more trouble with his depression since then.  He is an ex-cigarette smoker.  He does not use any illicit drugs.  FAMILY HISTORY:  Positive for hypertension and coronary artery occlusive disease.  REVIEW  OF SYSTEMS:  Not really obtainable male.  His daughter who was present that he has mostly been very sleepy.  He has been urinating a great deal since he arrived on the floor, but did not appear to have any sort of a urinary retention in the emergency department.  His medications are correct and is listed in the electronic medical record but I do not know what he has been taking for the last at least 2 days.  PHYSICAL EXAMINATION:  GENERAL:  A well-developed, well-nourished male, who has been mildly agitated and has received Ativan.  He is arousable. HEENT:  His mucous membranes are dry.  his neck is supple.  His pupils are reactive.  He has somewhat hard of hearing.  He has a mild speech impediment. CHEST:  Bilateral rhonchi. HEART:  Regular without gallop. ABDOMEN:  Soft.  No masses were felt.  Bowel sounds are present and active. EXTREMITIES:  Trace if any edema now. CENTRAL NERVOUS SYSTEM:  He is mildly agitated but has received medication for that.  Laboratory work is consistent with acute renal failure.  My assessment that he has acute renal failure.  He has altered mental status probably on  the basis of an acute renal failure, and his bipolar disease.  He has multiple other medical problems as listed.  I think he is dehydrated.  He may have a urinary tract infection.  He had an echocardiogram done last week but I do not, however, report yet.  My assessment then is that he had acute renal failure, altered mental status, potential urinary tract infection and my plan is for him to be admitted to step-down.  He is going to receive IV fluids.  He will have Ativan available for agitation.  He will have consultation with the nephrologist tomorrow to have renal ultrasound.  Repeat lab work tomorrow.     Johnny [name] L. Juanetta Gosling, M.D.     ELH/MEDQ  D:  12/01/2012  T:  12/02/2012  Job:  [id number]

## 2012-12-02 NOTE — Care Management Note (Unsigned)
    Page 1 of 1   12/04/2012     4:26:24 PM   CARE MANAGEMENT NOTE 12/04/2012  Patient:  Johnny [name], Johnny [name]   Account Number:  [id number]  Date Initiated:  12/02/2012  Documentation initiated by:  Sharrie Rothman  Subjective/Objective Assessment:   PT admitted from home with confusion and renal failure. Pt lives with children.     Action/Plan:   Pt unable to communicate with CM due to sedation. Will attempt to connect with children to determine discharge needs.   Anticipated DC Date:  12/07/2012   Anticipated DC Plan:  HOME W HOME HEALTH SERVICES         [facility] Choice  HOME HEALTH   Choice offered to / List presented to:  C-1 Patient        HH arranged  HH-1 RN  HH-10 DISEASE MANAGEMENT  HH-2 PT      HH agency  [facility]   Status of service:  In process, will continue to follow Medicare Important Message given?   (If response is "NO", the following Medicare IM given date fields will be blank) Date Medicare IM given:   Date Additional Medicare IM given:    Discharge Disposition:    Per UR Regulation:  Reviewed for med. necessity/level of care/duration of stay  If discussed at Long Length of Stay Meetings, dates discussed:    Comments:  12/04/12 1100 Idil Maslanka RN/CM 12/02/12 1500 Arlyss Queen, RN BSN CM

## 2012-12-02 NOTE — Progress Notes (Signed)
Pt is very agitated and combative. E-link is contacted, orders received and carried. Continue to monitor pt. closely for any changes.

## 2012-12-02 NOTE — Progress Notes (Signed)
eLink Physician-Brief Progress Note Patient Name: Johnny [name] DOB: 20-Mar-1941 MRN: [id number]  Date of Service  12/02/2012   HPI/Events of Note  Severe agitation - has lost IV - has not responded to ativan but not sure the patient received it.   eICU Interventions  Plan: Haldol 10 mg IM Nurse to call if no response in 30 minutes   Intervention Category Major Interventions: Delirium, psychosis, severe agitation - evaluation and management  DETERDING,ELIZABETH 12/02/2012, 3:28 AM

## 2012-12-02 NOTE — Progress Notes (Signed)
UR chart review completed.  

## 2012-12-03 LAB — GLUCOSE, CAPILLARY
Glucose-Capillary: 135 mg/dL — ABNORMAL HIGH (ref 70–99)
Glucose-Capillary: 137 mg/dL — ABNORMAL HIGH (ref 70–99)
Glucose-Capillary: 259 mg/dL — ABNORMAL HIGH (ref 70–99)
Glucose-Capillary: 133 mg/dL — ABNORMAL HIGH (ref 70–99)

## 2012-12-03 LAB — BASIC METABOLIC PANEL
Chloride: 103 mEq/L (ref 96–112)
GFR calc Af Amer: 24 mL/min — ABNORMAL LOW (ref >90)
GFR calc non Af Amer: 21 mL/min — ABNORMAL LOW (ref >90)
Potassium: 3.2 mEq/L — ABNORMAL LOW (ref 3.5–5.1)
Sodium: 147 mEq/L — ABNORMAL HIGH (ref 135–145)

## 2012-12-03 MED ORDER — LEVOFLOXACIN IN D5W 250 MG/50ML IV SOLN
INTRAVENOUS | Status: AC
  Filled 2012-12-03: qty 50

## 2012-12-03 MED ORDER — KCL IN DEXTROSE-NACL 20-5-0.45 MEQ/L-%-% IV SOLN
INTRAVENOUS | Status: DC
  Administered 2012-12-03 (×2): via INTRAVENOUS

## 2012-12-03 MED ORDER — POTASSIUM CHLORIDE CRYS ER 20 MEQ PO TBCR
20.0000 meq | EXTENDED_RELEASE_TABLET | Freq: Two times a day (BID) | ORAL | Status: DC
  Administered 2012-12-03 – 2012-12-05 (×5): 20 meq via ORAL
  Filled 2012-12-03 (×5): qty 1

## 2012-12-03 NOTE — Progress Notes (Signed)
Subjective: Interval History: has no complaint of vomiting. He denies any chest pain. No difficulty in breathing  Objective: Vital signs in last 24 hours: Temp:  [97.4 F (36.3 C)-99 F (37.2 C)] 98.5 F (36.9 C) (09/04 0738) Pulse Rate:  [56-73] 70 (09/04 0600) Resp:  [13-20] 20 (09/04 0600) BP: (122-150)/(64-79) 132/74 mmHg (09/04 0600) SpO2:  [93 %-97 %] 95 % (09/04 0600) FiO2 (%):  [32 %] 32 % (09/04 0155) Weight change:   Intake/Output from previous day: 09/03 0701 - 09/04 0700 In: 3987.5 [I.V.:3537.5; IV Piggyback:450] Out: 5600 [Urine:5600] Intake/Output this shift:    Generally patient is alert and remained confused. Patient doesn't know where he is. Chest is clear to auscultation Soft exam regular rate and rhythm no murmur Abdomen soft positive bowel sound Extremities he doesn't have any edema.  Lab Results:  Recent Labs  12/01/12 1147 12/02/12 0519  WBC 9.0 9.9  HGB 10.1* 10.2*  HCT 31.4* 31.9*  PLT 228 230   BMET:  Recent Labs  12/02/12 0519 12/02/12 1505 12/03/12 0442  NA 144  --  147*  K 3.1* 3.1* 3.2*  CL 95*  --  103  CO2 35*  --  35*  GLUCOSE 171*  --  140*  BUN 93*  --  62*  CREATININE 4.49*  --  2.89*  CALCIUM 9.8  --  9.5   No results found for this basename: PTH,  in the last 72 hours Iron Studies: No results found for this basename: IRON, TIBC, TRANSFERRIN, FERRITIN,  in the last 72 hours  Studies/Results: Dg Chest 1 View  12/01/2012   CLINICAL DATA:  Altered mental status, chest pain, shortness of Breath.  EXAM: CHEST - 1 VIEW  COMPARISON:  11/24/2012  FINDINGS: Cardiomegaly. Low lung volumes with bibasilar opacities, likely atelectasis. Mild vascular congestion. No overt edema. No effusions or acute bony abnormality.  IMPRESSION: Cardiomegaly with vascular congestion. Low volumes with bibasilar atelectasis.   Electronically Signed   By: Charlett Nose   On: 12/01/2012 12:27   Ct Head Wo Contrast  12/01/2012   *RADIOLOGY REPORT*   Clinical Data: Confusion.  Altered level of consciousness for 2 days.  CT HEAD WITHOUT CONTRAST  Technique:  Contiguous axial images were obtained from the base of the skull through the vertex without contrast.  Comparison: None.  Findings: There is some chronic microvascular ischemic change.  No evidence of acute abnormality including infarct, hemorrhage, mass lesion, mass effect, midline shift or abnormal extra-axial fluid collection is identified.  No hydrocephalus or pneumocephalus.  The calvarium is intact.  IMPRESSION: No acute finding.  Stable compared to prior exam.   Original Report Authenticated By: Holley Dexter, M.D.   US Renal  12/02/2012   *RADIOLOGY REPORT*  Clinical Data: Acute renal failure.  RENAL/URINARY TRACT ULTRASOUND COMPLETE  Comparison:  Ultrasound dated 07/19/2004  Findings:  Right Kidney:  13.7 cm in length.  Normal in appearance.  Left Kidney:  15.4 cm in length.  Normal appearance.  Bladder:  The bladder is empty with a Foley catheter in place.  IMPRESSION: Normal renal ultrasound.  No change in renal size since the prior exam.   Original Report Authenticated By: Francene Boyers, M.D.    I have reviewed the patient's current medications.  Assessment/Plan: Problem #1 acute kidney injury presently his BUN and creatinine is improving. Patient is none oliguric.  problem #2 hypokalemia potassium remains low but improving. Problem #3 history of hypertension his blood pressure seems reasonably controlled Problem #4  history of diabetes Problem #5 history of cardiomyopathy Problem #6 history of diastolic dysfunction patient is on Lasix he was good improvement. Problem #7 history of altered mental status. Patient seems to be somewhat better but remains disoriented.  Problem #8 hypernatremia most likely from normal saline and lack of free water.. Plan: We'll change his IV fluid to to half-normal saline with 20 mEq of KCl at 125 cc per hour. We'll check his basic metabolic panel in  the morning.   LOS: 2 days   Amairany Schumpert S 12/03/2012,7:42 AM

## 2012-12-03 NOTE — Progress Notes (Signed)
Subjective: He seems better. He is less agitated but still confused. He has no other new complaints.  Objective: Vital signs in last 24 hours: Temp:  [97.4 F (36.3 C)-99 F (37.2 C)] 98.5 F (36.9 C) (09/04 0738) Pulse Rate:  [56-73] 69 (09/04 0800) Resp:  [13-21] 15 (09/04 0800) BP: (107-150)/(64-79) 107/75 mmHg (09/04 0800) SpO2:  [93 %-96 %] 96 % (09/04 0800) FiO2 (%):  [32 %] 32 % (09/04 0155) Weight change:  Last BM Date: 11/29/12  Intake/Output from previous day: 09/03 0701 - 09/04 0700 In: 4112.5 [I.V.:3662.5; IV Piggyback:450] Out: 5600 [Urine:5600]  PHYSICAL EXAM General appearance: alert, cooperative, mild distress and morbidly obese Resp: clear to auscultation bilaterally Cardio: regular rate and rhythm, S1, S2 normal, no murmur, click, rub or gallop GI: soft, non-tender; bowel sounds normal; no masses,  no organomegaly Extremities: extremities normal, atraumatic, no cyanosis or edema  Lab Results:    Basic Metabolic Panel:  Recent Labs  04/02/70 0519 12/02/12 1505 12/03/12 0442  NA 144  --  147*  K 3.1* 3.1* 3.2*  CL 95*  --  103  CO2 35*  --  35*  GLUCOSE 171*  --  140*  BUN 93*  --  62*  CREATININE 4.49*  --  2.89*  CALCIUM 9.8  --  9.5   Liver Function Tests:  Recent Labs  12/01/12 1147 12/02/12 0519  AST 26 29  ALT 17 18  ALKPHOS 76 76  BILITOT 0.3 0.4  PROT 8.1 8.0  ALBUMIN 4.4 4.3   No results found for this basename: LIPASE, AMYLASE,  in the last 72 hours No results found for this basename: AMMONIA,  in the last 72 hours CBC:  Recent Labs  12/01/12 1147 12/02/12 0519  WBC 9.0 9.9  NEUTROABS 5.9  --   HGB 10.1* 10.2*  HCT 31.4* 31.9*  MCV 87.5 86.9  PLT 228 230   Cardiac Enzymes: No results found for this basename: CKTOTAL, CKMB, CKMBINDEX, TROPONINI,  in the last 72 hours BNP: No results found for this basename: PROBNP,  in the last 72 hours D-Dimer: No results found for this basename: DDIMER,  in the last 72  hours CBG:  Recent Labs  12/02/12 0754 12/02/12 1659 12/02/12 1948 12/02/12 2348 12/03/12 0415 12/03/12 0718  GLUCAP 164* 145* 149* 141* 135* 137*   Hemoglobin A1C: No results found for this basename: HGBA1C,  in the last 72 hours Fasting Lipid Panel: No results found for this basename: CHOL, HDL, LDLCALC, TRIG, CHOLHDL, LDLDIRECT,  in the last 72 hours Thyroid Function Tests: No results found for this basename: TSH, T4TOTAL, FREET4, T3FREE, THYROIDAB,  in the last 72 hours Anemia Panel: No results found for this basename: VITAMINB12, FOLATE, FERRITIN, TIBC, IRON, RETICCTPCT,  in the last 72 hours Coagulation:  Recent Labs  12/01/12 1147  LABPROT 14.0  INR 1.10   Urine Drug Screen: Drugs of Abuse     Component Value Date/Time   LABOPIA POSITIVE* 12/01/2012 1303   COCAINSCRNUR NONE DETECTED 12/01/2012 1303   LABBENZ NONE DETECTED 12/01/2012 1303   AMPHETMU NONE DETECTED 12/01/2012 1303   THCU NONE DETECTED 12/01/2012 1303   LABBARB NONE DETECTED 12/01/2012 1303    Alcohol Level:  Recent Labs  12/01/12 1147  ETH <11   Urinalysis:  Recent Labs  12/01/12 1303  COLORURINE YELLOW  LABSPEC 1.015  PHURINE 5.5  GLUCOSEU NEGATIVE  HGBUR TRACE*  BILIRUBINUR NEGATIVE  KETONESUR NEGATIVE  PROTEINUR NEGATIVE  UROBILINOGEN 0.2  NITRITE NEGATIVE  LEUKOCYTESUR NEGATIVE   Misc. Labs:  ABGS No results found for this basename: PHART, PCO2, PO2ART, TCO2, HCO3,  in the last 72 hours CULTURES Recent Results (from the past 240 hour(s))  MRSA PCR SCREENING     Status: None   Collection Time    12/01/12  4:30 PM      Result Value Range Status   MRSA by PCR NEGATIVE  NEGATIVE Final   Comment:            The GeneXpert MRSA Assay (FDA     approved for NASAL specimens     only), is one component of a     comprehensive MRSA colonization     surveillance program. It is not     intended to diagnose MRSA     infection nor to guide or     monitor treatment for     MRSA  infections.   Studies/Results: Dg Chest 1 View  12/01/2012   CLINICAL DATA:  Altered mental status, chest pain, shortness of Breath.  EXAM: CHEST - 1 VIEW  COMPARISON:  11/24/2012  FINDINGS: Cardiomegaly. Low lung volumes with bibasilar opacities, likely atelectasis. Mild vascular congestion. No overt edema. No effusions or acute bony abnormality.  IMPRESSION: Cardiomegaly with vascular congestion. Low volumes with bibasilar atelectasis.   Electronically Signed   By: Charlett Nose   On: 12/01/2012 12:27   Ct Head Wo Contrast  12/01/2012   *RADIOLOGY REPORT*  Clinical Data: Confusion.  Altered level of consciousness for 2 days.  CT HEAD WITHOUT CONTRAST  Technique:  Contiguous axial images were obtained from the base of the skull through the vertex without contrast.  Comparison: None.  Findings: There is some chronic microvascular ischemic change.  No evidence of acute abnormality including infarct, hemorrhage, mass lesion, mass effect, midline shift or abnormal extra-axial fluid collection is identified.  No hydrocephalus or pneumocephalus.  The calvarium is intact.  IMPRESSION: No acute finding.  Stable compared to prior exam.   Original Report Authenticated By: Holley Dexter, M.D.   US Renal  12/02/2012   *RADIOLOGY REPORT*  Clinical Data: Acute renal failure.  RENAL/URINARY TRACT ULTRASOUND COMPLETE  Comparison:  Ultrasound dated 07/19/2004  Findings:  Right Kidney:  13.7 cm in length.  Normal in appearance.  Left Kidney:  15.4 cm in length.  Normal appearance.  Bladder:  The bladder is empty with a Foley catheter in place.  IMPRESSION: Normal renal ultrasound.  No change in renal size since the prior exam.   Original Report Authenticated By: Francene Boyers, M.D.    Medications:  Prior to Admission:  Prescriptions prior to admission  Medication Sig Dispense Refill  . allopurinol (ZYLOPRIM) 300 MG tablet Take 300 mg by mouth daily.       Marland Kitchen ALPRAZolam (XANAX) 0.5 MG tablet Take 0.5 mg by mouth 3  (three) times daily as needed for anxiety.       Marland Kitchen amLODipine (NORVASC) 10 MG tablet Take 10 mg by mouth daily.      Marland Kitchen aspirin 325 MG EC tablet Take 325 mg by mouth daily.        Marland Kitchen doxazosin (CARDURA) 4 MG tablet Take 4 mg by mouth at bedtime.      . metFORMIN (GLUCOPHAGE) 1000 MG tablet Take 1,000 mg by mouth 2 (two) times daily with a meal.        . metoprolol succinate (TOPROL-XL) 50 MG 24 hr tablet Take 50 mg by mouth daily.      Marland Kitchen  nitroGLYCERIN (NITROLINGUAL) 0.4 MG/SPRAY spray Place 1 spray under the tongue every 5 (five) minutes as needed for chest pain.  12 g  3  . Omega-3 Fatty Acids (FISH OIL) 1000 MG CAPS Take 1 capsule by mouth daily.        Marland Kitchen oxyCODONE-acetaminophen (PERCOCET) 10-325 MG per tablet Take 1 tablet by mouth every 8 (eight) hours as needed for pain.       . polysaccharide iron (NIFEREX) 150 MG CAPS capsule Take 150 mg by mouth daily.        . potassium chloride (KLOR-CON M10) 10 MEQ tablet Take 2 tablets (20 mEq total) by mouth 2 (two) times daily.  360 tablet  1  . QUEtiapine (SEROQUEL) 50 MG tablet Take 50 mg by mouth at bedtime.        . sertraline (ZOLOFT) 50 MG tablet Take 50 mg by mouth daily.      Marland Kitchen torsemide (DEMADEX) 100 MG tablet Take 50 mg by mouth 2 (two) times daily.       . valsartan (DIOVAN) 320 MG tablet TAKE 1 TABLET BY MOUTH EVERY DAY  90 tablet  1  . morphine (MS CONTIN) 60 MG 12 hr tablet Take 60 mg by mouth 2 (two) times daily.        Scheduled: . albuterol  2.5 mg Nebulization Q6H  . amLODipine  10 mg Oral Daily  . doxazosin  4 mg Oral QHS  . enoxaparin (LOVENOX) injection  30 mg Subcutaneous Q24H  . gabapentin  200 mg Oral TID  . ipratropium  0.5 mg Nebulization Q6H  . levofloxacin (LEVAQUIN) IV  250 mg Intravenous Q24H  . metoprolol succinate  50 mg Oral Daily  . sertraline  50 mg Oral Daily  . sodium chloride  3 mL Intravenous Q12H   Continuous: . dextrose 5 % and 0.45 % NaCl with KCl 20 mEq/L 125 mL/hr at 12/03/12 0804    XBJ:YNWGNFAOZHYQM, acetaminophen, ALPRAZolam, alum & mag hydroxide-simeth, haloperidol lactate, LORazepam, morphine injection, nitroGLYCERIN, ondansetron (ZOFRAN) IV, ondansetron, oxyCODONE, oxyCODONE-acetaminophen  Assesment: He has altered mental status and probably some baseline dementia. This is improving. He has acute on chronic renal failure which is also improving. He has hypokalemia and that will be replaced. He has a history of coronary artery occlusive disease, COPD and CHF and although seem to be fairly stable despite large volumes of IV fluids Active Problems:   * No active hospital problems. *    Plan: Continue current treatments he may be able to move from the ICU later today try to get him up in a chair today    LOS: 2 days   Jevaun Strick L 12/03/2012, 8:32 AM

## 2012-12-04 DIAGNOSIS — N179 Acute kidney failure, unspecified: Secondary | ICD-10-CM | POA: Diagnosis present

## 2012-12-04 DIAGNOSIS — R4182 Altered mental status, unspecified: Secondary | ICD-10-CM | POA: Diagnosis present

## 2012-12-04 LAB — IRON AND TIBC
Iron: 44 ug/dL (ref 42–135)
Saturation Ratios: 14 % — ABNORMAL LOW (ref 20–55)
TIBC: 307 ug/dL (ref 215–435)
UIBC: 263 ug/dL (ref 125–400)

## 2012-12-04 LAB — BASIC METABOLIC PANEL
BUN: 37 mg/dL — ABNORMAL HIGH (ref 6–23)
CO2: 35 mEq/L — ABNORMAL HIGH (ref 19–32)
Calcium: 9.8 mg/dL (ref 8.4–10.5)
Chloride: 102 mEq/L (ref 96–112)
Creatinine, Ser: 2.27 mg/dL — ABNORMAL HIGH (ref 0.50–1.35)
GFR calc Af Amer: 32 mL/min — ABNORMAL LOW (ref >90)
GFR calc non Af Amer: 28 mL/min — ABNORMAL LOW (ref >90)
Glucose, Bld: 183 mg/dL — ABNORMAL HIGH (ref 70–99)
Potassium: 3.6 mEq/L (ref 3.5–5.1)
Sodium: 143 mEq/L (ref 135–145)

## 2012-12-04 LAB — PHOSPHORUS: Phosphorus: 2.5 mg/dL (ref 2.3–4.6)

## 2012-12-04 LAB — FERRITIN: Ferritin: 86 ng/mL (ref 22–322)

## 2012-12-04 MED ORDER — ENOXAPARIN SODIUM 40 MG/0.4ML ~~LOC~~ SOLN
40.0000 mg | SUBCUTANEOUS | Status: DC
  Administered 2012-12-04: 40 mg via SUBCUTANEOUS
  Filled 2012-12-04: qty 0.4

## 2012-12-04 MED ORDER — LEVOFLOXACIN 250 MG PO TABS
250.0000 mg | ORAL_TABLET | Freq: Every day | ORAL | Status: DC
  Administered 2012-12-04: 250 mg via ORAL
  Filled 2012-12-04: qty 1

## 2012-12-04 MED ORDER — SODIUM CHLORIDE 0.9 % IV SOLN
INTRAVENOUS | Status: DC
  Administered 2012-12-04: 50 mL/h via INTRAVENOUS
  Administered 2012-12-05: 05:00:00 via INTRAVENOUS

## 2012-12-04 NOTE — Progress Notes (Addendum)
Subjective: Interval History: has no complaint of vomiting. He denies any chest pain. No difficulty in breathing. He offers no compalint.  Objective: Vital signs in last 24 hours: Temp:  [98.1 F (36.7 C)-99.5 F (37.5 C)] 98.1 F (36.7 C) (09/05 0435) Pulse Rate:  [57-76] 57 (09/05 0435) Resp:  [15-19] 18 (09/05 0435) BP: (107-157)/(63-86) 157/79 mmHg (09/05 0435) SpO2:  [93 %-97 %] 97 % (09/05 0435) Weight:  [106.8 kg (235 lb 7.2 oz)] 106.8 kg (235 lb 7.2 oz) (09/05 0435) Weight change:   Intake/Output from previous day: 09/04 0701 - 09/05 0700 In: 240 [P.O.:240] Out: 1950 [Urine:1950] Intake/Output this shift:    Generally patient is alert and seems to be oriented to place and person Chest is clear to auscultation Soft exam regular rate and rhythm no murmur Abdomen soft positive bowel sound Extremities he doesn't have any edema.  Lab Results:  Recent Labs  12/01/12 1147 12/02/12 0519  WBC 9.0 9.9  HGB 10.1* 10.2*  HCT 31.4* 31.9*  PLT 228 230   BMET:   Recent Labs  12/03/12 0442 12/04/12 0619  NA 147* 143  K 3.2* 3.6  CL 103 102  CO2 35* 35*  GLUCOSE 140* 183*  BUN 62* 37*  CREATININE 2.89* 2.27*  CALCIUM 9.5 9.8   No results found for this basename: PTH,  in the last 72 hours Iron Studies: No results found for this basename: IRON, TIBC, TRANSFERRIN, FERRITIN,  in the last 72 hours  Studies/Results: US Renal  12/02/2012   *RADIOLOGY REPORT*  Clinical Data: Acute renal failure.  RENAL/URINARY TRACT ULTRASOUND COMPLETE  Comparison:  Ultrasound dated 07/19/2004  Findings:  Right Kidney:  13.7 cm in length.  Normal in appearance.  Left Kidney:  15.4 cm in length.  Normal appearance.  Bladder:  The bladder is empty with a Foley catheter in place.  IMPRESSION: Normal renal ultrasound.  No change in renal size since the prior exam.   Original Report Authenticated By: Francene Boyers, M.D.    I have reviewed the patient's current  medications.  Assessment/Plan: Problem #1 acute kidney injury presently his BUN and creatinine is improving. Patient is none oliguric. His BUN and Creatinine is 37 and 2.27 returning to his base line  problem #2 hypokalemia potassium has coreccted. Problem #3 history of hypertension his blood pressure seems reasonably controlled Problem #4 history of diabetes Problem #5 history of cardiomyopathy Problem #6 history of diastolic dysfunction patient no signs of fluid over load Problem #7 history of altered mental status has improved.  Problem #8 hypernatremia most likely from normal saline and lack of free water. Sodium has corrected. Plan:change iv fluid to ns at 50 cc/hr Encourage po fluid intake I will follow patient when he is discharged We'll check his basic metabolic panel in the morning.   LOS: 3 days   Adalae Baysinger S 12/04/2012,7:19 AM

## 2012-12-04 NOTE — Evaluation (Signed)
Physical Therapy Evaluation Patient Details Name: Johnny [name] MRN: [id number] DOB: [birth year] Today's Date: 12/04/2012 Time: 0865-7846 PT Time Calculation (min): 21 min  PT Assessment / Plan / Recommendation History of Present Illness  Pt is admitted with Uremia and encephalopathy resultant.  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  He suffers from depression which is increased since the death of his wife 1 year ago.  Clinical Impression   Pt was seen for evaluation and found to be very close to prior functional level.  He had no gait instability and should transition to home with no problem.    PT Assessment  Patent does not need any further PT services    Follow Up Recommendations  No PT follow up    Does the patient have the potential to tolerate intense rehabilitation      Barriers to Discharge        Equipment Recommendations  None recommended by PT    Recommendations for Other Services     Frequency      Precautions / Restrictions Precautions Precautions: None Restrictions Weight Bearing Restrictions: No   Pertinent Vitals/Pain       Mobility  Bed Mobility Bed Mobility: Sit to Supine;Supine to Sit Supine to Sit: 7: Independent Sit to Supine: 7: Independent Transfers Transfers: Sit to Stand;Stand to Sit Sit to Stand: 7: Independent Stand to Sit: 7: Independent Ambulation/Gait Ambulation/Gait Assistance: 7: Independent Ambulation Distance (Feet): 400 Feet Assistive device: None Gait Pattern: Within Functional Limits Gait velocity: WNL Stairs: No Wheelchair Mobility Wheelchair Mobility: No    Exercises     PT Diagnosis:    PT Problem List:   PT Treatment Interventions:       PT Goals(Current goals can be found in the care plan section) Acute Rehab PT Goals PT Goal Formulation: No goals set, d/c therapy  Visit Information  Last PT Received On: 12/04/12 History of Present Illness: Pt is admitted with Uremia and  encephalopathy resultant.  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  He suffers from depression which is increased since the death of his wife 1 year ago.       Prior Functioning  Home Living Family/patient expects to be discharged to:: Private residence Living Arrangements: Children Available Help at Discharge: Family;Available PRN/intermittently Type of Home: House Home Access: Stairs to enter [employer] of Steps: 1 Entrance Stairs-Rails: None Home Layout: One level Home Equipment: Walker - 2 wheels;Cane - single point Prior Function Level of Independence: Independent Communication Communication: No difficulties    Cognition  Cognition Arousal/Alertness: Awake/alert Behavior During Therapy: WFL for tasks assessed/performed Overall Cognitive Status: Within Functional Limits for tasks assessed    Extremity/Trunk Assessment Upper Extremity Assessment Upper Extremity Assessment: Overall WFL for tasks assessed Lower Extremity Assessment Lower Extremity Assessment: Overall WFL for tasks assessed   Balance Balance Balance Assessed: Yes High Level Balance High Level Balance Activites: Side stepping;Backward walking;Direction changes;Turns;Sudden stops;Head turns High Level Balance Comments: No LOB with above activities  End of Session PT - End of Session Equipment Utilized During Treatment: Gait belt Activity Tolerance: Patient tolerated treatment well Patient left: in chair;with call bell/phone within reach;with chair alarm set Nurse Communication: Mobility status  GP     Myrlene Broker L 12/04/2012, 10:08 AM

## 2012-12-04 NOTE — Progress Notes (Signed)
PHARMACIST - PHYSICIAN COMMUNICATION DR:   Juanetta Gosling CONCERNING: Antibiotic IV to Oral Route Change Policy  RECOMMENDATION: This patient is receiving Levaquin by the intravenous route.  Based on criteria approved by the Pharmacy and Therapeutics Committee, the antibiotic(s) is/are being converted to the equivalent oral dose form(s).   DESCRIPTION: These criteria include:  Patient being treated for a respiratory tract infection, urinary tract infection, cellulitis or clostridium difficile associated diarrhea if on metronidazole  The patient is not neutropenic and does not exhibit a GI malabsorption state  The patient is eating (either orally or via tube) and/or has been taking other orally administered medications for a least 24 hours  The patient is improving clinically and has a Tmax < 100.5  CONSIDER d/c antibiotic if appropriate.   If you have questions about this conversion, please contact the Pharmacy Department  [x]   [phone] )  [facility] []   [phone] )  [facility]  []   [phone] )  [facility] []   [phone] )  [facility]  Junita Push, PharmD, BCPS 12/04/2012@10 :35 AM

## 2012-12-04 NOTE — Progress Notes (Signed)
Subjective: He is much more awake and alert. Looks pretty comfortable. He is weak.  Objective: Vital signs in last 24 hours: Temp:  [98.1 F (36.7 C)-99.5 F (37.5 C)] 98.1 F (36.7 C) (09/05 0435) Pulse Rate:  [57-76] 60 (09/05 0811) Resp:  [15-19] 18 (09/05 0435) BP: (128-157)/(63-86) 157/79 mmHg (09/05 0435) SpO2:  [93 %-97 %] 93 % (09/05 0755) Weight:  [106.8 kg (235 lb 7.2 oz)] 106.8 kg (235 lb 7.2 oz) (09/05 0435) Weight change:  Last BM Date: 11/29/12  Intake/Output from previous day: 09/04 0701 - 09/05 0700 In: 240 [P.O.:240] Out: 3450 [Urine:3450]  PHYSICAL EXAM General appearance: alert, cooperative, mild distress, morbidly obese and Minimal confusion Resp: clear to auscultation bilaterally Cardio: regular rate and rhythm, S1, S2 normal, no murmur, click, rub or gallop GI: soft, non-tender; bowel sounds normal; no masses,  no organomegaly Extremities: extremities normal, atraumatic, no cyanosis or edema  Lab Results:    Basic Metabolic Panel:  Recent Labs  60/45/40 0442 12/04/12 0619  NA 147* 143  K 3.2* 3.6  CL 103 102  CO2 35* 35*  GLUCOSE 140* 183*  BUN 62* 37*  CREATININE 2.89* 2.27*  CALCIUM 9.5 9.8  PHOS  --  2.5   Liver Function Tests:  Recent Labs  12/01/12 1147 12/02/12 0519  AST 26 29  ALT 17 18  ALKPHOS 76 76  BILITOT 0.3 0.4  PROT 8.1 8.0  ALBUMIN 4.4 4.3   No results found for this basename: LIPASE, AMYLASE,  in the last 72 hours No results found for this basename: AMMONIA,  in the last 72 hours CBC:  Recent Labs  12/01/12 1147 12/02/12 0519  WBC 9.0 9.9  NEUTROABS 5.9  --   HGB 10.1* 10.2*  HCT 31.4* 31.9*  MCV 87.5 86.9  PLT 228 230   Cardiac Enzymes: No results found for this basename: CKTOTAL, CKMB, CKMBINDEX, TROPONINI,  in the last 72 hours BNP: No results found for this basename: PROBNP,  in the last 72 hours D-Dimer: No results found for this basename: DDIMER,  in the last 72 hours CBG:  Recent Labs  12/02/12 2348 12/03/12 0415 12/03/12 0718 12/03/12 1132 12/03/12 1624 12/03/12 2043  GLUCAP 141* 135* 137* 259* 133* 199*   Hemoglobin A1C: No results found for this basename: HGBA1C,  in the last 72 hours Fasting Lipid Panel: No results found for this basename: CHOL, HDL, LDLCALC, TRIG, CHOLHDL, LDLDIRECT,  in the last 72 hours Thyroid Function Tests: No results found for this basename: TSH, T4TOTAL, FREET4, T3FREE, THYROIDAB,  in the last 72 hours Anemia Panel: No results found for this basename: VITAMINB12, FOLATE, FERRITIN, TIBC, IRON, RETICCTPCT,  in the last 72 hours Coagulation:  Recent Labs  12/01/12 1147  LABPROT 14.0  INR 1.10   Urine Drug Screen: Drugs of Abuse     Component Value Date/Time   LABOPIA POSITIVE* 12/01/2012 1303   COCAINSCRNUR NONE DETECTED 12/01/2012 1303   LABBENZ NONE DETECTED 12/01/2012 1303   AMPHETMU NONE DETECTED 12/01/2012 1303   THCU NONE DETECTED 12/01/2012 1303   LABBARB NONE DETECTED 12/01/2012 1303    Alcohol Level:  Recent Labs  12/01/12 1147  ETH <11   Urinalysis:  Recent Labs  12/01/12 1303  COLORURINE YELLOW  LABSPEC 1.015  PHURINE 5.5  GLUCOSEU NEGATIVE  HGBUR TRACE*  BILIRUBINUR NEGATIVE  KETONESUR NEGATIVE  PROTEINUR NEGATIVE  UROBILINOGEN 0.2  NITRITE NEGATIVE  LEUKOCYTESUR NEGATIVE   Misc. Labs:  ABGS No results found for this basename:  PHART, PCO2, PO2ART, TCO2, HCO3,  in the last 72 hours CULTURES Recent Results (from the past 240 hour(s))  MRSA PCR SCREENING     Status: None   Collection Time    12/01/12  4:30 PM      Result Value Range Status   MRSA by PCR NEGATIVE  NEGATIVE Final   Comment:            The GeneXpert MRSA Assay (FDA     approved for NASAL specimens     only), is one component of a     comprehensive MRSA colonization     surveillance program. It is not     intended to diagnose MRSA     infection nor to guide or     monitor treatment for     MRSA infections.   Studies/Results: US  Renal  12/02/2012   *RADIOLOGY REPORT*  Clinical Data: Acute renal failure.  RENAL/URINARY TRACT ULTRASOUND COMPLETE  Comparison:  Ultrasound dated 07/19/2004  Findings:  Right Kidney:  13.7 cm in length.  Normal in appearance.  Left Kidney:  15.4 cm in length.  Normal appearance.  Bladder:  The bladder is empty with a Foley catheter in place.  IMPRESSION: Normal renal ultrasound.  No change in renal size since the prior exam.   Original Report Authenticated By: Francene Boyers, M.D.    Medications:  Prior to Admission:  Prescriptions prior to admission  Medication Sig Dispense Refill  . allopurinol (ZYLOPRIM) 300 MG tablet Take 300 mg by mouth daily.       Marland Kitchen ALPRAZolam (XANAX) 0.5 MG tablet Take 0.5 mg by mouth 3 (three) times daily as needed for anxiety.       Marland Kitchen amLODipine (NORVASC) 10 MG tablet Take 10 mg by mouth daily.      Marland Kitchen aspirin 325 MG EC tablet Take 325 mg by mouth daily.        Marland Kitchen doxazosin (CARDURA) 4 MG tablet Take 4 mg by mouth at bedtime.      . metFORMIN (GLUCOPHAGE) 1000 MG tablet Take 1,000 mg by mouth 2 (two) times daily with a meal.        . metoprolol succinate (TOPROL-XL) 50 MG 24 hr tablet Take 50 mg by mouth daily.      . nitroGLYCERIN (NITROLINGUAL) 0.4 MG/SPRAY spray Place 1 spray under the tongue every 5 (five) minutes as needed for chest pain.  12 g  3  . Omega-3 Fatty Acids (FISH OIL) 1000 MG CAPS Take 1 capsule by mouth daily.        Marland Kitchen oxyCODONE-acetaminophen (PERCOCET) 10-325 MG per tablet Take 1 tablet by mouth every 8 (eight) hours as needed for pain.       . polysaccharide iron (NIFEREX) 150 MG CAPS capsule Take 150 mg by mouth daily.        . potassium chloride (KLOR-CON M10) 10 MEQ tablet Take 2 tablets (20 mEq total) by mouth 2 (two) times daily.  360 tablet  1  . QUEtiapine (SEROQUEL) 50 MG tablet Take 50 mg by mouth at bedtime.        . sertraline (ZOLOFT) 50 MG tablet Take 50 mg by mouth daily.      Marland Kitchen torsemide (DEMADEX) 100 MG tablet Take 50 mg by mouth 2  (two) times daily.       . valsartan (DIOVAN) 320 MG tablet TAKE 1 TABLET BY MOUTH EVERY DAY  90 tablet  1  . morphine (MS CONTIN) 60 MG 12  hr tablet Take 60 mg by mouth 2 (two) times daily.        Scheduled: . albuterol  2.5 mg Nebulization Q6H  . amLODipine  10 mg Oral Daily  . doxazosin  4 mg Oral QHS  . enoxaparin (LOVENOX) injection  30 mg Subcutaneous Q24H  . gabapentin  200 mg Oral TID  . ipratropium  0.5 mg Nebulization Q6H  . levofloxacin (LEVAQUIN) IV  250 mg Intravenous Q24H  . metoprolol succinate  50 mg Oral Daily  . potassium chloride  20 mEq Oral BID  . sertraline  50 mg Oral Daily  . sodium chloride  3 mL Intravenous Q12H   Continuous: . sodium chloride 50 mL/hr (12/04/12 0815)   ZOX:[id number], acetaminophen, ALPRAZolam, alum & mag hydroxide-simeth, haloperidol lactate, LORazepam, morphine injection, nitroGLYCERIN, ondansetron (ZOFRAN) IV, ondansetron, oxyCODONE, oxyCODONE-acetaminophen  Assesment: He is admitted with altered mental status probably related to acute renal failure. In addition to that he has a history of coronary artery occlusive disease, congestive heart failure, chronic pain. These medical problems appear to be pretty stable. He has bipolar disease and probably has some element of dementia Active Problems:   * No active hospital problems. *    Plan: Continue current treatments TC Foley catheter physical therapy consult    LOS: 3 days   Linnet Bottari L 12/04/2012, 9:14 AM

## 2012-12-05 LAB — BASIC METABOLIC PANEL
CO2: 32 mEq/L (ref 19–32)
Calcium: 9.7 mg/dL (ref 8.4–10.5)
GFR calc non Af Amer: 36 mL/min — ABNORMAL LOW (ref >90)
Potassium: 3.2 mEq/L — ABNORMAL LOW (ref 3.5–5.1)
Sodium: 142 mEq/L (ref 135–145)

## 2012-12-05 MED ORDER — METFORMIN HCL 1000 MG PO TABS: 500.0000 mg | ORAL_TABLET | Freq: Two times a day (BID) | ORAL | Status: AC

## 2012-12-05 MED ORDER — POTASSIUM CHLORIDE CRYS ER 20 MEQ PO TBCR
40.0000 meq | EXTENDED_RELEASE_TABLET | Freq: Once | ORAL | Status: AC
  Administered 2012-12-05: 40 meq via ORAL
  Filled 2012-12-05: qty 2

## 2012-12-05 MED ORDER — GABAPENTIN 100 MG PO CAPS: 200.0000 mg | ORAL_CAPSULE | Freq: Three times a day (TID) | ORAL | Status: AC

## 2012-12-05 NOTE — Progress Notes (Signed)
Subjective: Interval History: has no complaint of vomiting. He denies any chest pain. No difficulty in breathing. He offers no compalint.  Objective: Vital signs in last 24 hours: Temp:  [98.2 F (36.8 C)-99.1 F (37.3 C)] 99.1 F (37.3 C) (09/06 0300) Pulse Rate:  [64-77] 77 (09/06 0300) Resp:  [16-18] 18 (09/06 0300) BP: (140-176)/(56-77) 176/77 mmHg (09/06 0300) SpO2:  [90 %-95 %] 90 % (09/06 0717) Weight:  [107.2 kg (236 lb 5.3 oz)] 107.2 kg (236 lb 5.3 oz) (09/06 0300) Weight change: 0.4 kg (14.1 oz)  Intake/Output from previous day: 09/05 0701 - 09/06 0700 In: 720 [P.O.:720] Out: 2850 [Urine:2850] Intake/Output this shift: Total I/O In: -  Out: 300 [Urine:300]  Generally patient is alert and seems to be oriented to place and person Chest is clear to auscultation Soft exam regular rate and rhythm no murmur Abdomen soft positive bowel sound Extremities he doesn't have any edema.  Lab Results: No results found for this basename: WBC, HGB, HCT, PLT,  in the last 72 hours BMET:   Recent Labs  12/04/12 0619 12/05/12 0720  NA 143 142  K 3.6 3.2*  CL 102 102  CO2 35* 32  GLUCOSE 183* 177*  BUN 37* 27*  CREATININE 2.27* 1.82*  CALCIUM 9.8 9.7   No results found for this basename: PTH,  in the last 72 hours Iron Studies:   Recent Labs  12/04/12 0619  IRON 44  TIBC 307  FERRITIN 86    Studies/Results: No results found.  I have reviewed the patient's current medications.  Assessment/Plan: Problem #1 acute kidney injury presently his BUN and creatinine is improving. Patient is none oliguric. His BUN and Creatinine is 27 and 1.82 returning to his base line  problem #2 hypokalemia potassium is low today. Problem #3 history of hypertension his blood pressure seems reasonably controlled Problem #4 history of diabetes Problem #5 history of cardiomyopathy Problem #6 history of diastolic dysfunction patient no signs of fluid over load Problem #7 history of  altered mental status has improved.  Problem #8 hypernatremia most likely from normal saline and lack of free water. Sodium has corrected. Plan:kcl 40 meq po once I will see patient in 4 weeks as an out patient   LOS: 4 days   Rowe Warman S 12/05/2012,12:00 PM

## 2012-12-05 NOTE — Progress Notes (Signed)
D/c instructions reviewed with patient and daughter.  Verbalized understanding. Pt dc'd to home with daughter. Schonewitz, Candelaria Stagers 12/05/2012

## 2012-12-06 NOTE — Discharge Summary (Signed)
NAME:  Johnny [name], Johnny [name] NO.:  [id number]  MEDICAL RECORD NO.:  [id number]  LOCATION:  [facility]                          FACILITY:  [facility]  PHYSICIAN:  Kingsley Callander. Ouida Sills, MD       DATE OF BIRTH:  Feb 27, 1941  DATE OF ADMISSION:  12/01/2012 DATE OF DISCHARGE:  09/06/2014LH                              DISCHARGE SUMMARY   DISCHARGE DIAGNOSES: 1. Acute renal failure. 2. Hypokalemia. 3. Diabetes. 4. Normocytic anemia. 5. Chronic kidney disease. 6. Diabetes. 7. Hypertension. 8. Osteoarthritis. 9. Barrett's esophagus. 10.Coronary artery disease. 11.Sleep apnea. 12.Gastroesophageal reflux disease. 13.Hypothyroidism.  DISCHARGE MEDICATIONS:  Allopurinol 300 mg daily, Xanax 0.5 mg t.i.d. p.r.n., amlodipine 10 mg daily, aspirin 325 mg daily, Cardura 4 mg daily, Toprol-XL 50 mg daily, sublingual nitroglycerin p.r.n., fish oil 1000 mg daily, Percocet 10/325 q.8 hours p.r.n., Niferex 150 daily, Klor- Con 10 two b.i.d. Seroquel 50 mg daily, Zoloft 50 mg daily, __________ mg b.i.d., Diovan 320 mg daily, gabapentin 200 mg t.i.d., metformin dose reduced to 500 mg b.i.d.  HOSPITAL COURSE:  This patient is a 72 year old male patient of Dr. Juanetta Gosling, who presented with weakness after not eating and drinking for several days prior to admission.  He was found to be in acute renal failure with a BUN and creatinine of 62 and 2.89.  He reportedly has a baseline creatinine in the 1.7 range.  He underwent a renal ultrasound which revealed no evidence of hydronephrosis.  He was treated with IV fluids.  BUN and creatinine trended down to 37 and 2.2 then 27 and 1.8, potassium normalized to 3.6, but dropped to 3.2 on the 6th and is being supplemented with 40 mEq of potassium prior to discharge.  He is feeling much better.  He is eating and drinking well now.  He had an anemia panel revealing an iron of 44, TIBC of 307, and ferritin of 86.  His diabetes control was stable.  His metformin dose is  being reduced to 500 mg twice a day in light of his creatinine at 1.8.  The patient will follow up with Dr. Juanetta Gosling in 1 week.  Gabapentin has been added at a dose of 200 mg t.i.d.  Other medications are being continued as per his prehospitalization list.     Kingsley Callander. Ouida Sills, MD     ROF/MEDQ  D:  12/05/2012  T:  12/05/2012  Job:  [id number]

## 2012-12-22 ENCOUNTER — Encounter: Payer: Self-pay | Admitting: Adult Health

## 2012-12-22 ENCOUNTER — Ambulatory Visit (INDEPENDENT_AMBULATORY_CARE_PROVIDER_SITE_OTHER): Payer: Medicare Other | Admitting: Adult Health

## 2012-12-22 VITALS — BP 132/77 | HR 69 | Ht 71.0 in | Wt 242.0 lb

## 2012-12-22 DIAGNOSIS — I10 Essential (primary) hypertension: Secondary | ICD-10-CM

## 2012-12-22 DIAGNOSIS — I2510 Atherosclerotic heart disease of native coronary artery without angina pectoris: Secondary | ICD-10-CM

## 2012-12-22 DIAGNOSIS — I429 Cardiomyopathy, unspecified: Secondary | ICD-10-CM

## 2012-12-22 MED ORDER — PANTOPRAZOLE SODIUM 40 MG PO TBEC: 40.0000 mg | DELAYED_RELEASE_TABLET | Freq: Every day | ORAL | Status: AC

## 2012-12-22 NOTE — Assessment & Plan Note (Signed)
No cardiac complaint of chest pain or exercise intolerance. Will continue on BB . No planned cardiac testing at this time.

## 2012-12-22 NOTE — Patient Instructions (Addendum)
Your physician recommends that you schedule a follow-up appointment in: 4 months You will receive a reminder letter two months in advance reminding you to call and schedule your appointment. If you don't receive this letter, please contact our office.  Your physician has recommended you make the following change in your medication:  1. Start Protonix 40 mg daily

## 2012-12-22 NOTE — Progress Notes (Signed)
HPI: Mr. Plante is a 72-year-old patient formerly of Dr. Daleen Squibb we are following for ongoing assessment and management of CAD, chronic diastolic CHF, with previous MI. The patient was recently admitted to [facility] in the setting of acute renal failure, hypokalemia, with history of chronic kidney disease. The patient not eating or drinking regularly for several days prior to that admission. He had a renal ultrasound which revealed no evidence of hydronephrosis. He was treated with IV fluids and BUN and creatinine improved. Potassium was supplemented. The patient was started on gabapentin 200 mg 3 times a day.    He has had no further complaints from a cardiac standpoint. He has lost approximately 20 pounds since being seen last. He is medically compliant. He is being followed by Dr. Juanetta Gosling and Dr. Kristian Covey for ongoing labs and renal function assessment.          No Known Allergies  Current Outpatient Prescriptions  Medication Sig Dispense Refill  . allopurinol (ZYLOPRIM) 300 MG tablet Take 300 mg by mouth daily.       Marland Kitchen ALPRAZolam (XANAX) 0.5 MG tablet Take 0.5 mg by mouth 3 (three) times daily as needed for anxiety.       Marland Kitchen amLODipine (NORVASC) 10 MG tablet Take 10 mg by mouth daily.      Marland Kitchen aspirin 325 MG EC tablet Take 325 mg by mouth daily.        Marland Kitchen doxazosin (CARDURA) 4 MG tablet Take 4 mg by mouth at bedtime.      . gabapentin (NEURONTIN) 100 MG capsule Take 2 capsules (200 mg total) by mouth 3 (three) times daily.  180 capsule  12  . metFORMIN (GLUCOPHAGE) 1000 MG tablet Take 0.5 tablets (500 mg total) by mouth 2 (two) times daily with a meal.  60 tablet  12  . metoprolol succinate (TOPROL-XL) 50 MG 24 hr tablet Take 50 mg by mouth daily.      Marland Kitchen morphine (MS CONTIN) 60 MG 12 hr tablet Take 60 mg by mouth 2 (two) times daily.       . nitroGLYCERIN (NITROLINGUAL) 0.4 MG/SPRAY spray Place 1 spray under the tongue every 5 (five) minutes as needed for chest pain.  12 g  3  . Omega-3 Fatty  Acids (FISH OIL) 1000 MG CAPS Take 1 capsule by mouth daily.        Marland Kitchen oxyCODONE-acetaminophen (PERCOCET) 10-325 MG per tablet Take 1 tablet by mouth every 8 (eight) hours as needed for pain.       . polysaccharide iron (NIFEREX) 150 MG CAPS capsule Take 150 mg by mouth daily.        . potassium chloride (KLOR-CON M10) 10 MEQ tablet Take 2 tablets (20 mEq total) by mouth 2 (two) times daily.  360 tablet  1  . QUEtiapine (SEROQUEL) 50 MG tablet Take 50 mg by mouth at bedtime.        . sertraline (ZOLOFT) 50 MG tablet Take 50 mg by mouth daily.      Marland Kitchen torsemide (DEMADEX) 100 MG tablet Take 50 mg by mouth 2 (two) times daily.       . valsartan (DIOVAN) 320 MG tablet TAKE 1 TABLET BY MOUTH EVERY DAY  90 tablet  1  . pantoprazole (PROTONIX) 40 MG tablet Take 1 tablet (40 mg total) by mouth daily.  30 tablet  6   No current facility-administered medications for this visit.    Past Medical History  Diagnosis Date  . Diabetes  mellitus   . Hypertension   . Osteoarthritis   . Barrett's esophagus   . Myocardial infarction   . Thyroid disease     hypothyroidism  . Sleep apnea   . GERD (gastroesophageal reflux disease)     Past Surgical History  Procedure Laterality Date  . Nissen fundoplication    . Carpal tunnel release      bilateral carpal tunnel release  . Back surgery    . Upper gastrointestinal endoscopy  01/28/08  . Upper gastrointestinal endoscopy  08/21/04  . Coronary angioplasty with stent placement      HYQ:MVHQIO of systems complete and found to be negative unless listed above  PHYSICAL EXAM BP 132/77  Pulse 69  Ht 5\' 11"  (1.803 m)  Wt 242 lb (109.77 kg)  BMI 33.77 kg/m2  SpO2 96%  General: Well developed, well nourished, in no acute distress Head: Eyes PERRLA, No xanthomas.   Normal cephalic and atramatic  Lungs: Clear bilaterally with mild crackles in the bases.  Heart: HRRR S1 S2, without MRG.  Pulses are 2+ & equal.            No carotid bruit. No JVD.  No  abdominal bruits. No femoral bruits. Abdomen: Bowel sounds are positive, abdomen soft and non-tender without masses or                  Hernia's noted. Msk:  Back normal, normal gait. Normal strength and tone for age. Extremities: No clubbing, cyanosis, 1+ dependent edema.  DP +1 Neuro: Alert and oriented X 3. Psych:  Good affect, responds appropriately  EKG: Sinus bradycardia rae of 54 bpm with T-wave flattening in the lateral leads.  ASSESSMENT AND PLAN

## 2012-12-22 NOTE — Assessment & Plan Note (Addendum)
Excellent control of BP at this time. He remains on amlodipine and and diovan, despite CKD.Johnny [name] He denies dizziness or weakness. Mild edema is noted in the lower extremities, likely from CCB. He will continue current medications. Should he continue to lose weight, will have to titrate the medications to prevent hypotension and near syncope. Will see him again in 4 months. Labs per Dr. Fausto Skillern.

## 2012-12-22 NOTE — Progress Notes (Deleted)
Name: Johnny [name]    DOB: 27-Jun-1940  Age: 72 y.o.  MR#: [id number]       PCP:  Fredirick Maudlin, MD      Insurance: Payor: MEDICARE / Plan: MEDICARE PART A AND B / Product Type: *No Product type* /   CC:    Chief Complaint  Patient presents with  . Chest Pain  . Hypertension    VS Filed Vitals:   12/22/12 1424  BP: 132/77  Pulse: 69  Height: 5\' 11"  (1.803 m)  Weight: 242 lb (109.77 kg)  SpO2: 96%    Weights Current Weight  12/22/12 242 lb (109.77 kg)  12/05/12 236 lb 5.3 oz (107.2 kg)  05/20/12 265 lb (120.203 kg)    Blood Pressure  BP Readings from Last 3 Encounters:  12/22/12 132/77  12/05/12 176/77  05/20/12 154/79     Admit date:  (Not on file) Last encounter with RMR:  12/01/2012   Allergy Review of patient's allergies indicates no known allergies.  Current Outpatient Prescriptions  Medication Sig Dispense Refill  . allopurinol (ZYLOPRIM) 300 MG tablet Take 300 mg by mouth daily.       Marland Kitchen ALPRAZolam (XANAX) 0.5 MG tablet Take 0.5 mg by mouth 3 (three) times daily as needed for anxiety.       Marland Kitchen amLODipine (NORVASC) 10 MG tablet Take 10 mg by mouth daily.      Marland Kitchen aspirin 325 MG EC tablet Take 325 mg by mouth daily.        Marland Kitchen doxazosin (CARDURA) 4 MG tablet Take 4 mg by mouth at bedtime.      . gabapentin (NEURONTIN) 100 MG capsule Take 2 capsules (200 mg total) by mouth 3 (three) times daily.  180 capsule  12  . metFORMIN (GLUCOPHAGE) 1000 MG tablet Take 0.5 tablets (500 mg total) by mouth 2 (two) times daily with a meal.  60 tablet  12  . metoprolol succinate (TOPROL-XL) 50 MG 24 hr tablet Take 50 mg by mouth daily.      Marland Kitchen morphine (MS CONTIN) 60 MG 12 hr tablet Take 60 mg by mouth 2 (two) times daily.       . nitroGLYCERIN (NITROLINGUAL) 0.4 MG/SPRAY spray Place 1 spray under the tongue every 5 (five) minutes as needed for chest pain.  12 g  3  . Omega-3 Fatty Acids (FISH OIL) 1000 MG CAPS Take 1 capsule by mouth daily.        Marland Kitchen oxyCODONE-acetaminophen  (PERCOCET) 10-325 MG per tablet Take 1 tablet by mouth every 8 (eight) hours as needed for pain.       . polysaccharide iron (NIFEREX) 150 MG CAPS capsule Take 150 mg by mouth daily.        . potassium chloride (KLOR-CON M10) 10 MEQ tablet Take 2 tablets (20 mEq total) by mouth 2 (two) times daily.  360 tablet  1  . QUEtiapine (SEROQUEL) 50 MG tablet Take 50 mg by mouth at bedtime.        . sertraline (ZOLOFT) 50 MG tablet Take 50 mg by mouth daily.      Marland Kitchen torsemide (DEMADEX) 100 MG tablet Take 50 mg by mouth 2 (two) times daily.       . valsartan (DIOVAN) 320 MG tablet TAKE 1 TABLET BY MOUTH EVERY DAY  90 tablet  1   No current facility-administered medications for this visit.    Discontinued Meds:   There are no discontinued medications.  Patient Active Problem List  Diagnosis Date Noted  . Altered mental status 12/04/2012  . Acute on chronic renal failure 12/04/2012  . HYPERLIPIDEMIA-MIXED 10/06/2009  . EDEMA 06/09/2009  . CORONARY ATHEROSCLEROSIS, NATIVE VESSEL 04/13/2009  . CARDIOMYOPATHY, SECONDARY 04/13/2009  . DIASTOLIC HEART FAILURE, CHRONIC 04/13/2009  . UNSPECIFIED HYPOTHYROIDISM 08/30/2008  . DM 08/30/2008  . HYPERTENSION 08/30/2008  . MI 08/30/2008  . BARRETTS ESOPHAGUS 08/30/2008  . OSTEOARTHRITIS, GENERALIZED, MULTIPLE JOINTS 08/30/2008  . Unspecified sleep apnea 08/30/2008    LABS    Component Value Date/Time   NA 142 12/05/2012 0720   NA 143 12/04/2012 0619   NA 147* 12/03/2012 0442   K 3.2* 12/05/2012 0720   K 3.6 12/04/2012 0619   K 3.2* 12/03/2012 0442   CL 102 12/05/2012 0720   CL 102 12/04/2012 0619   CL 103 12/03/2012 0442   CO2 32 12/05/2012 0720   CO2 35* 12/04/2012 0619   CO2 35* 12/03/2012 0442   GLUCOSE 177* 12/05/2012 0720   GLUCOSE 183* 12/04/2012 0619   GLUCOSE 140* 12/03/2012 0442   BUN 27* 12/05/2012 0720   BUN 37* 12/04/2012 0619   BUN 62* 12/03/2012 0442   CREATININE 1.82* 12/05/2012 0720   CREATININE 2.27* 12/04/2012 0619   CREATININE 2.89* 12/03/2012 0442    CREATININE 0.84 07/12/2010 1433   CALCIUM 9.7 12/05/2012 0720   CALCIUM 9.8 12/04/2012 0619   CALCIUM 9.5 12/03/2012 0442   GFRNONAA 36* 12/05/2012 0720   GFRNONAA 28* 12/04/2012 0619   GFRNONAA 21* 12/03/2012 0442   GFRAA 42* 12/05/2012 0720   GFRAA 32* 12/04/2012 0619   GFRAA 24* 12/03/2012 0442   CMP     Component Value Date/Time   NA 142 12/05/2012 0720   K 3.2* 12/05/2012 0720   CL 102 12/05/2012 0720   CO2 32 12/05/2012 0720   GLUCOSE 177* 12/05/2012 0720   BUN 27* 12/05/2012 0720   CREATININE 1.82* 12/05/2012 0720   CREATININE 0.84 07/12/2010 1433   CALCIUM 9.7 12/05/2012 0720   PROT 8.0 12/02/2012 0519   ALBUMIN 4.3 12/02/2012 0519   AST 29 12/02/2012 0519   ALT 18 12/02/2012 0519   ALKPHOS 76 12/02/2012 0519   BILITOT 0.4 12/02/2012 0519   GFRNONAA 36* 12/05/2012 0720   GFRAA 42* 12/05/2012 0720       Component Value Date/Time   WBC 9.9 12/02/2012 0519   WBC 9.0 12/01/2012 1147   WBC 8.6 08/25/2011 2000   HGB 10.2* 12/02/2012 0519   HGB 10.1* 12/01/2012 1147   HGB 11.0* 08/25/2011 2000   HCT 31.9* 12/02/2012 0519   HCT 31.4* 12/01/2012 1147   HCT 34.8* 08/25/2011 2000   MCV 86.9 12/02/2012 0519   MCV 87.5 12/01/2012 1147   MCV 83.1 08/25/2011 2000    Lipid Panel     Component Value Date/Time   CHOL  Value: 98        ATP III CLASSIFICATION:  <200     mg/dL   Desirable  914-782  mg/dL   Borderline High  >=956    mg/dL   High        21/30/8657 0505   TRIG 100 03/30/2009 0505   HDL 22* 03/30/2009 0505   CHOLHDL 4.5 03/30/2009 0505   VLDL 20 03/30/2009 0505   LDLCALC  Value: 56        Total Cholesterol/HDL:CHD Risk Coronary Heart Disease Risk Table                     Men  Women  1/2 Average Risk   3.4   3.3  Average Risk       5.0   4.4  2 X Average Risk   9.6   7.1  3 X Average Risk  23.4   11.0        Use the calculated Patient Ratio above and the CHD Risk Table to determine the patient's CHD Risk.        ATP III CLASSIFICATION (LDL):  <100     mg/dL   Optimal  454-098  mg/dL   Near or Above                    Optimal   130-159  mg/dL   Borderline  119-147  mg/dL   High  >829     mg/dL   Very High 56/21/3086 0505    ABG    Component Value Date/Time   TCO2 26 09/28/2008 1558     Lab Results  Component Value Date   TSH 0.635 ***Test methodology is 3rd generation TSH**** 01/27/2009   BNP (last 3 results) No results found for this basename: PROBNP,  in the last 8760 hours Cardiac Panel (last 3 results) No results found for this basename: CKTOTAL, CKMB, TROPONINI, RELINDX,  in the last 72 hours  Iron/TIBC/Ferritin    Component Value Date/Time   IRON 44 12/04/2012 0619   TIBC 307 12/04/2012 0619   FERRITIN 86 12/04/2012 0619     EKG Orders placed in visit on 12/22/12  . EKG 12-LEAD     Prior Assessment and Plan Problem List as of 12/22/2012   UNSPECIFIED HYPOTHYROIDISM   DM   HYPERLIPIDEMIA-MIXED   HYPERTENSION   Last Assessment & Plan   05/20/2012 Office Visit Written 05/20/2012  1:57 PM by Gaylord Shih, MD     Elevated today. He assures me he is not taking ibuprofen or naproxen. He is on 4 antihypertensive meds. We'll not add another. I do not want to increase his diuretic further either.    MI   CORONARY ATHEROSCLEROSIS, NATIVE VESSEL   Last Assessment & Plan   05/20/2012 Office Visit Written 05/20/2012  1:58 PM by Gaylord Shih, MD     Stable. Continue secondary preventative therapy.    CARDIOMYOPATHY, SECONDARY   DIASTOLIC HEART FAILURE, CHRONIC   Last Assessment & Plan   05/20/2012 Office Visit Written 05/20/2012  1:58 PM by Gaylord Shih, MD     Relatively stable. Encouraged to lose weight and salt restrict. No changes made in his medications.    BARRETTS ESOPHAGUS   OSTEOARTHRITIS, GENERALIZED, MULTIPLE JOINTS   Unspecified sleep apnea   EDEMA   Last Assessment & Plan   09/23/2011 Office Visit Written 09/23/2011  4:22 PM by Gaylord Shih, MD     Maybe a little worse. See above management of diastolic heart failure.    Altered mental status   Acute on chronic renal failure        Imaging: Dg Chest 1 View  12/01/2012   CLINICAL DATA:  Altered mental status, chest pain, shortness of Breath.  EXAM: CHEST - 1 VIEW  COMPARISON:  11/24/2012  FINDINGS: Cardiomegaly. Low lung volumes with bibasilar opacities, likely atelectasis. Mild vascular congestion. No overt edema. No effusions or acute bony abnormality.  IMPRESSION: Cardiomegaly with vascular congestion. Low volumes with bibasilar atelectasis.   Electronically Signed   By: Charlett Nose   On: 12/01/2012 12:27   Dg Chest 2 View  11/24/2012   *RADIOLOGY REPORT*  Clinical Data: Short of breath  CHEST - 2 VIEW  Comparison: 08/25/2011  Findings: COPD with pulmonary hyperinflation.  Cardiac enlargement without heart failure.  Negative for pneumonia.  No mass or effusion.  IMPRESSION: COPD.  No acute cardiopulmonary abnormality.   Original Report Authenticated By: Janeece Riggers, M.D.   Ct Head Wo Contrast  12/01/2012   *RADIOLOGY REPORT*  Clinical Data: Confusion.  Altered level of consciousness for 2 days.  CT HEAD WITHOUT CONTRAST  Technique:  Contiguous axial images were obtained from the base of the skull through the vertex without contrast.  Comparison: None.  Findings: There is some chronic microvascular ischemic change.  No evidence of acute abnormality including infarct, hemorrhage, mass lesion, mass effect, midline shift or abnormal extra-axial fluid collection is identified.  No hydrocephalus or pneumocephalus.  The calvarium is intact.  IMPRESSION: No acute finding.  Stable compared to prior exam.   Original Report Authenticated By: Holley Dexter, M.D.   US Renal  12/02/2012   *RADIOLOGY REPORT*  Clinical Data: Acute renal failure.  RENAL/URINARY TRACT ULTRASOUND COMPLETE  Comparison:  Ultrasound dated 07/19/2004  Findings:  Right Kidney:  13.7 cm in length.  Normal in appearance.  Left Kidney:  15.4 cm in length.  Normal appearance.  Bladder:  The bladder is empty with a Foley catheter in place.  IMPRESSION: Normal renal  ultrasound.  No change in renal size since the prior exam.   Original Report Authenticated By: Francene Boyers, M.D.

## 2012-12-23 ENCOUNTER — Ambulatory Visit: Payer: Medicare Other | Admitting: Orthopedic Surgery

## 2013-02-10 ENCOUNTER — Telehealth: Payer: Self-pay

## 2013-02-10 NOTE — Telephone Encounter (Signed)
Pt was referred by Dr. Juanetta Gosling for colonoscopy. His referral said that his last one was done with [facility]. I called pt and he could not remember when he had his last one. His med list indicates that he would need OV. But pt said his daughter has got him scheduled for colonoscopy with someone in [place] I told him that I will let Dr. Juanetta Gosling know and if he needs anything, they can call us.

## 2013-02-18 ENCOUNTER — Telehealth: Payer: Self-pay

## 2013-02-18 NOTE — Telephone Encounter (Signed)
T/C from pt's son. Sandria Bales, Montez Hageman, and said his father said he had an appt for colonoscopy tomorrow and he was trying to find out where and all of the info. I told I had talked to the pt on 02/10/2013 and he said that his daughter had scheduled him with someone in [place] and that I had sent Dr. Juanetta Gosling a letter and informed him. He then let me speak with his sister, Thereasa Distance and she said the pt just told us that because he is not wanting to have a colonoscopy. I asked her if he is having any problems and she said no, he just needs the colonoscopy. She asked if she could schedule one for him. I asked who was his POA and she said he does not have one. I told her that I cannot schedule him one then. After review of his meds, he would need OV first anyway. I told her if she has pt call me, and he wants to schedule ov appt, I will be glad to and she said she will talk to him.

## 2013-02-18 NOTE — Telephone Encounter (Signed)
You may want to check on this. Looks like he may have seen Dr. Karilyn Cota in 2012.

## 2013-03-10 HISTORY — PX: POLYPECTOMY: SHX149

## 2013-03-11 NOTE — Telephone Encounter (Signed)
If they call back, I will certainly check this out.

## 2013-04-07 ENCOUNTER — Encounter: Payer: Self-pay | Admitting: Cardiovascular Disease

## 2013-04-17 ENCOUNTER — Other Ambulatory Visit: Payer: Self-pay | Admitting: Cardiology

## 2013-05-11 ENCOUNTER — Other Ambulatory Visit: Payer: Self-pay | Admitting: Cardiology

## 2013-05-13 ENCOUNTER — Ambulatory Visit (INDEPENDENT_AMBULATORY_CARE_PROVIDER_SITE_OTHER): Payer: Medicare Other | Admitting: Otolaryngology

## 2013-05-18 ENCOUNTER — Other Ambulatory Visit: Payer: Self-pay | Admitting: Cardiology

## 2013-05-20 ENCOUNTER — Ambulatory Visit (INDEPENDENT_AMBULATORY_CARE_PROVIDER_SITE_OTHER): Payer: Medicare Other | Admitting: Otolaryngology

## 2013-05-20 DIAGNOSIS — R220 Localized swelling, mass and lump, head: Secondary | ICD-10-CM

## 2013-05-20 DIAGNOSIS — R221 Localized swelling, mass and lump, neck: Secondary | ICD-10-CM

## 2013-05-20 DIAGNOSIS — R1312 Dysphagia, oropharyngeal phase: Secondary | ICD-10-CM

## 2013-05-20 DIAGNOSIS — H903 Sensorineural hearing loss, bilateral: Secondary | ICD-10-CM

## 2013-05-24 ENCOUNTER — Other Ambulatory Visit (INDEPENDENT_AMBULATORY_CARE_PROVIDER_SITE_OTHER): Payer: Self-pay | Admitting: Otolaryngology

## 2013-05-24 DIAGNOSIS — IMO0002 Reserved for concepts with insufficient information to code with codable children: Secondary | ICD-10-CM

## 2013-05-24 DIAGNOSIS — R229 Localized swelling, mass and lump, unspecified: Principal | ICD-10-CM

## 2013-05-27 ENCOUNTER — Ambulatory Visit (HOSPITAL_COMMUNITY): Payer: Medicare Other

## 2013-06-01 ENCOUNTER — Ambulatory Visit (HOSPITAL_COMMUNITY)
Admission: RE | Admit: 2013-06-01 | Discharge: 2013-06-01 | Disposition: A | Discharge status: Home / Self Care | Payer: Medicare Other | Source: Ambulatory Visit | Attending: Otolaryngology | Admitting: Otolaryngology

## 2013-06-01 ENCOUNTER — Encounter (HOSPITAL_COMMUNITY): Payer: Self-pay

## 2013-06-01 ENCOUNTER — Other Ambulatory Visit (INDEPENDENT_AMBULATORY_CARE_PROVIDER_SITE_OTHER): Payer: Self-pay | Admitting: Otolaryngology

## 2013-06-01 DIAGNOSIS — IMO0002 Reserved for concepts with insufficient information to code with codable children: Secondary | ICD-10-CM

## 2013-06-01 DIAGNOSIS — I10 Essential (primary) hypertension: Secondary | ICD-10-CM | POA: Insufficient documentation

## 2013-06-01 DIAGNOSIS — E119 Type 2 diabetes mellitus without complications: Secondary | ICD-10-CM | POA: Insufficient documentation

## 2013-06-01 DIAGNOSIS — R229 Localized swelling, mass and lump, unspecified: Secondary | ICD-10-CM

## 2013-06-01 DIAGNOSIS — E042 Nontoxic multinodular goiter: Secondary | ICD-10-CM | POA: Insufficient documentation

## 2013-06-01 DIAGNOSIS — K115 Sialolithiasis: Secondary | ICD-10-CM | POA: Insufficient documentation

## 2013-06-01 DIAGNOSIS — N289 Disorder of kidney and ureter, unspecified: Secondary | ICD-10-CM | POA: Insufficient documentation

## 2013-06-01 LAB — POCT I-STAT CREATININE: CREATININE: 2.3 mg/dL — AB (ref 0.50–1.35)

## 2013-06-01 MED ORDER — SODIUM CHLORIDE 0.9 % IJ SOLN
INTRAMUSCULAR | Status: AC
  Filled 2013-06-01: qty 500

## 2013-06-04 ENCOUNTER — Other Ambulatory Visit (INDEPENDENT_AMBULATORY_CARE_PROVIDER_SITE_OTHER): Payer: Self-pay | Admitting: Otolaryngology

## 2013-06-04 DIAGNOSIS — R221 Localized swelling, mass and lump, neck: Secondary | ICD-10-CM

## 2013-06-05 ENCOUNTER — Other Ambulatory Visit: Payer: Self-pay | Admitting: Cardiology

## 2013-06-08 ENCOUNTER — Ambulatory Visit (HOSPITAL_COMMUNITY): Payer: Medicare Other

## 2013-06-10 ENCOUNTER — Encounter (HOSPITAL_COMMUNITY): Payer: Self-pay

## 2013-06-10 ENCOUNTER — Ambulatory Visit (HOSPITAL_COMMUNITY)
Admission: RE | Admit: 2013-06-10 | Discharge: 2013-06-10 | Disposition: A | Discharge status: Home / Self Care | Payer: Medicare Other | Source: Ambulatory Visit | Attending: Otolaryngology | Admitting: Otolaryngology

## 2013-06-10 ENCOUNTER — Other Ambulatory Visit (INDEPENDENT_AMBULATORY_CARE_PROVIDER_SITE_OTHER): Payer: Self-pay | Admitting: Otolaryngology

## 2013-06-10 DIAGNOSIS — R221 Localized swelling, mass and lump, neck: Secondary | ICD-10-CM

## 2013-06-10 DIAGNOSIS — E041 Nontoxic single thyroid nodule: Secondary | ICD-10-CM | POA: Insufficient documentation

## 2013-06-10 DIAGNOSIS — E079 Disorder of thyroid, unspecified: Secondary | ICD-10-CM | POA: Insufficient documentation

## 2013-06-10 MED ORDER — LIDOCAINE HCL (PF) 2 % IJ SOLN
10.0000 mL | Freq: Once | INTRAMUSCULAR | Status: AC
  Administered 2013-06-10: 10 mL

## 2013-06-10 NOTE — Progress Notes (Signed)
Biopsy complete no signs of distress  

## 2013-06-10 NOTE — Procedures (Signed)
PreOperative Dx: RIGHT thyroid nodule Postoperative Dx: RIGHT thyroid nodule Procedure:   US guided FNA of RIGHT thyroid nodule Radiologist:  Thornton Papas Anesthesia:  3 ml of 2% lidocaine Specimen:  FNA x 4 RIGHT thyroid nodule  EBL:   < 1 ml Complications: None

## 2013-06-10 NOTE — Discharge Instructions (Signed)
Thyroid Biopsy °The thyroid gland is a butterfly-shaped gland situated in the front of the neck. It produces hormones which affect metabolism, growth and development, and body temperature. A thyroid biopsy is a procedure in which small samples of tissue or fluid are removed from the thyroid gland or mass and examined under a microscope. This test is done to determine the cause of thyroid problems, such as infection, cancer, or other thyroid problems. °There are 2 ways to obtain samples: °1. Fine needle biopsy. Samples are removed using a thin needle inserted through the skin and into the thyroid gland or mass. °2. Open biopsy. Samples are removed after a cut (incision) is made through the skin. °LET YOUR CAREGIVER KNOW ABOUT:  °· Allergies. °· Medications taken including herbs, eye drops, over-the-counter medications, and creams. °· Use of steroids (by mouth or creams). °· Previous problems with anesthetics or numbing medicine. °· Possibility of pregnancy, if this applies. °· History of blood clots (thrombophlebitis). °· History of bleeding or blood problems. °· Previous surgery. °· Other health problems. °RISKS AND COMPLICATIONS °· Bleeding from the site. The risk of bleeding is higher if you have a bleeding disorder or are taking any blood thinning medications (anticoagulants). °· Infection. °· Injury to structures near the thyroid gland. °BEFORE THE PROCEDURE  °This is a procedure that can be done as an outpatient. Confirm the time that you need to arrive for your procedure. Confirm whether there is a need to fast or withhold any medications. A blood sample may be done to determine your blood clotting time. Medicine may be given to help you relax (sedative). °PROCEDURE °Fine needle biopsy. °You will be awake during the procedure. You may be asked to lie on your back with your head tipped backward to extend your neck. Let your caregiver know if you cannot tolerate the positioning. An area on your neck will be  cleansed. A needle is inserted through the skin of your neck. You may feel a mild discomfort during this procedure. You may be asked to avoid coughing, talking, swallowing, or making sounds during some portions of the procedure. The needle is withdrawn once tissue or fluid samples have been removed. Pressure may be applied to the neck to reduce swelling and ensure that bleeding has stopped. The samples will be sent for examination.  °Open biopsy. °You will be given general anesthesia. You will be asleep during the procedure. An incision is made in your neck. A sample of thyroid tissue or the mass is removed. The tissue sample or mass will be sent for examination. The sample or mass may be examined during the biopsy. If the sample or mass contains cancer cells, some or all of the thyroid gland may be removed. The incision is closed with stitches. °AFTER THE PROCEDURE  °Your recovery will be assessed and monitored. If there are no problems, as an outpatient, you should be able to go home shortly after the procedure. °If you had a fine needle biopsy: °· You may have soreness at the biopsy site for 1 to 2 days. °If you had an open biopsy:  °· You may have soreness at the biopsy site for 3 to 4 days. °· You may have a hoarse voice or sore throat for 1 to 2 days. °Obtaining the Test Results °It is your responsibility to obtain your test results. Do not assume everything is normal if you have not heard from your caregiver or the medical facility. It is important for you to follow up   on all of your test results. °HOME CARE INSTRUCTIONS  °· Keeping your head raised on a pillow when you are lying down may ease biopsy site discomfort. °· Supporting the back of your head and neck with both hands as you sit up from a lying position may ease biopsy site discomfort. °· Only take over-the-counter or prescription medicines for pain, discomfort, or fever as directed by your caregiver. °· Throat lozenges or gargling with warm salt  water may help to soothe a sore throat. °SEEK IMMEDIATE MEDICAL CARE IF:  °· You have severe bleeding from the biopsy site. °· You have difficulty swallowing. °· You have a fever. °· You have increased pain, swelling, redness, or warmth at the biopsy site. °· You notice pus coming from the biopsy site. °· You have swollen glands (lymph nodes) in your neck. °Document Released: 01/13/2007 Document Revised: 07/13/2012 Document Reviewed: 06/15/2008 °[employer]® Patient Information ©2014 [employer]. ° °

## 2013-06-17 ENCOUNTER — Ambulatory Visit (INDEPENDENT_AMBULATORY_CARE_PROVIDER_SITE_OTHER): Payer: Medicare Other | Admitting: Otolaryngology

## 2013-06-17 DIAGNOSIS — K115 Sialolithiasis: Secondary | ICD-10-CM

## 2013-06-17 DIAGNOSIS — D449 Neoplasm of uncertain behavior of unspecified endocrine gland: Secondary | ICD-10-CM

## 2013-07-06 ENCOUNTER — Other Ambulatory Visit: Payer: Self-pay | Admitting: Cardiology

## 2013-08-11 ENCOUNTER — Ambulatory Visit: Payer: Medicare Other | Admitting: Cardiovascular Disease

## 2013-08-13 ENCOUNTER — Telehealth: Payer: Self-pay

## 2013-08-13 MED ORDER — DOXAZOSIN MESYLATE 4 MG PO TABS: 4.0000 mg | ORAL_TABLET | Freq: Every day | ORAL | Status: DC

## 2013-08-13 NOTE — Telephone Encounter (Signed)
Refill complete message to T jackson to sched fu apt with K.Lawrence NP was due for January fu

## 2013-08-16 ENCOUNTER — Telehealth: Payer: Self-pay | Admitting: Cardiology

## 2013-08-16 NOTE — Telephone Encounter (Signed)
Received fax refill request ° °Rx # [id number] °Medication:  Doxazosin Mesylate 4 mg tab °Qty 90 °Sig:  Take one tablet by mouth at bedtime °Physician:  Branch  ° ° °

## 2013-08-16 NOTE — Telephone Encounter (Signed)
rx already refilled confirmed with [facility]

## 2013-08-17 ENCOUNTER — Telehealth: Payer: Self-pay | Admitting: Cardiology

## 2013-08-17 MED ORDER — DOXAZOSIN MESYLATE 4 MG PO TABS: 4.0000 mg | ORAL_TABLET | Freq: Every day | ORAL | Status: DC

## 2013-08-17 NOTE — Telephone Encounter (Signed)
Received fax refill request  Rx # [id number] Medication:  Doxazosin Mesylate 4 mg tab Qty 90 Sig:  Take one tablet by mouth at bedtime Physician:  Harl Bowie

## 2013-08-17 NOTE — Telephone Encounter (Signed)
Medication sent via escribe.  

## 2013-08-25 ENCOUNTER — Ambulatory Visit: Payer: Medicare Other | Admitting: Cardiovascular Disease

## 2013-08-31 ENCOUNTER — Other Ambulatory Visit: Payer: Self-pay | Admitting: Adult Health

## 2013-09-02 ENCOUNTER — Encounter: Payer: Self-pay | Admitting: Cardiovascular Disease

## 2013-09-07 ENCOUNTER — Telehealth: Payer: Self-pay | Admitting: *Deleted

## 2013-09-07 NOTE — Telephone Encounter (Signed)
Daughter Camelia Phenes) called stated father c/o chest pain.  Had been outside "being active" per daughter.  Stated she has just gotten to his house.  Stated he was c/o chest pain when he called her, but now saying it is not that bad.  States that he is clammy & nauseated also.  Patient giving vague answer about rating his chest pain currently.  Advised her to go ahead & have him use his NTG now.  Also suggest she call 911 / take to ED for evaluation.  Daughter verbalized understanding.

## 2013-09-27 ENCOUNTER — Ambulatory Visit: Payer: Medicare Other | Admitting: Cardiovascular Disease

## 2013-09-27 ENCOUNTER — Encounter: Payer: Self-pay | Admitting: Cardiovascular Disease

## 2014-01-20 ENCOUNTER — Ambulatory Visit (INDEPENDENT_AMBULATORY_CARE_PROVIDER_SITE_OTHER): Payer: Medicare Other | Admitting: Cardiovascular Disease

## 2014-01-20 ENCOUNTER — Encounter: Payer: Self-pay | Admitting: *Deleted

## 2014-01-20 VITALS — BP 112/63 | HR 72 | Ht 71.0 in | Wt 249.0 lb

## 2014-01-20 DIAGNOSIS — I5032 Chronic diastolic (congestive) heart failure: Secondary | ICD-10-CM

## 2014-01-20 DIAGNOSIS — R079 Chest pain, unspecified: Secondary | ICD-10-CM

## 2014-01-20 DIAGNOSIS — R0609 Other forms of dyspnea: Secondary | ICD-10-CM

## 2014-01-20 DIAGNOSIS — R609 Edema, unspecified: Secondary | ICD-10-CM

## 2014-01-20 DIAGNOSIS — I10 Essential (primary) hypertension: Secondary | ICD-10-CM

## 2014-01-20 DIAGNOSIS — I25118 Atherosclerotic heart disease of native coronary artery with other forms of angina pectoris: Secondary | ICD-10-CM

## 2014-01-20 DIAGNOSIS — I429 Cardiomyopathy, unspecified: Secondary | ICD-10-CM

## 2014-01-20 NOTE — Progress Notes (Signed)
Patient ID: Johnny [name], male   DOB: [birth year], 73 y.o.   MRN: [id number]      SUBJECTIVE: The patient is a 73 year old male who I am evaluating for the first time today. He has a history of coronary artery disease with multiple prior stents.  He has two stents in his left circumflex coronary artery and two stents in the right coronary artery, specifically distally and in the PDA. Most recent coronary angiogram was on 07/12/2010. The left main coronary artery was normal. The LAD had a 40% lesion at the takeoff of the first septal perforator. A very small diagonal branch had a 70-80% ostial stenosis. The left circumflex coronary artery had patent stents with 40% stenosis at the origin of the second obtuse marginal branch. The right coronary artery had a mid 50-60% stenosis with patent stents distally in the RCA and PDA. There wa an 80-90% stenosis in a small distal posterolateral branch. Echocardiogram on 11/24/2012 demonstrated normal left ventricular systolic function, EF 13-08%, mild LVH, and grade 1 diastolic dysfunction. He also has type 2 diabetes mellitus, hypertension, and chronic diastolic heart failure. ECG performed in the office today demonstrates normal sinus rhythm, heart rate 67 beats per minute, with no ischemic ST segment or T-wave abnormalities noted.  His primary complaint relates to pain in his knees. He has had prior right knee replacement. He has had dyspnea on exertion for the past 3 months. He had not been experiencing chest pain in several years until yesterday, which was alleviated with one nitro spray. His dyspnea is most prominent when he is doing yardwork. He has been checking HR at home and it had been as low as 41 bpm and was associated with fatigue, for which Toprol-XL was discontinued by PCP (Dr. Luan Pulling). He had been on 50 mg daily. He prefers not to wear compression stockings and only wears diabetic socks.    Review of Systems: As per "subjective", otherwise  negative.  No Known Allergies  Current Outpatient Prescriptions  Medication Sig Dispense Refill  . allopurinol (ZYLOPRIM) 300 MG tablet Take 300 mg by mouth daily.       Marland Kitchen ALPRAZolam (XANAX) 0.5 MG tablet Take 0.5 mg by mouth 3 (three) times daily as needed for anxiety.       Marland Kitchen amLODipine (NORVASC) 10 MG tablet Take 10 mg by mouth daily.      Marland Kitchen aspirin 325 MG EC tablet Take 325 mg by mouth daily.        Marland Kitchen doxazosin (CARDURA) 4 MG tablet Take 1 tablet (4 mg total) by mouth at bedtime.  60 tablet  6  . gabapentin (NEURONTIN) 100 MG capsule Take 2 capsules (200 mg total) by mouth 3 (three) times daily.  180 capsule  12  . KLOR-CON M10 10 MEQ tablet TAKE 2 TABLETS BY MOUTH TWICE A DAY  360 tablet  6  . metFORMIN (GLUCOPHAGE) 1000 MG tablet Take 0.5 tablets (500 mg total) by mouth 2 (two) times daily with a meal.  60 tablet  12  . metoprolol succinate (TOPROL-XL) 50 MG 24 hr tablet TAKE 1 TABLET BY MOUTH EVERY DAY **CALL OFFICE TO MAKE APPOINTMENT FOR FUTURE REFILLS**  90 tablet  3  . morphine (MS CONTIN) 60 MG 12 hr tablet Take 60 mg by mouth 2 (two) times daily.       . Omega-3 Fatty Acids (FISH OIL) 1000 MG CAPS Take 1 capsule by mouth daily.        Marland Kitchen oxyCODONE-acetaminophen (PERCOCET)  10-325 MG per tablet Take 1 tablet by mouth every 8 (eight) hours as needed for pain.       . pantoprazole (PROTONIX) 40 MG tablet Take 1 tablet (40 mg total) by mouth daily.  30 tablet  6  . polysaccharide iron (NIFEREX) 150 MG CAPS capsule Take 150 mg by mouth daily.        . QUEtiapine (SEROQUEL) 50 MG tablet Take 50 mg by mouth at bedtime.        . sertraline (ZOLOFT) 50 MG tablet Take 50 mg by mouth daily.      Marland Kitchen torsemide (DEMADEX) 100 MG tablet Take 50 mg by mouth 2 (two) times daily.       . valsartan (DIOVAN) 320 MG tablet TAKE 1 TABLET BY MOUTH EVERY DAY  90 tablet  3   No current facility-administered medications for this visit.    Past Medical History  Diagnosis Date  . Diabetes mellitus   .  Hypertension   . Osteoarthritis   . Barrett's esophagus   . Myocardial infarction   . Thyroid disease     hypothyroidism  . Sleep apnea   . GERD (gastroesophageal reflux disease)     Past Surgical History  Procedure Laterality Date  . Nissen fundoplication    . Carpal tunnel release      bilateral carpal tunnel release  . Back surgery    . Upper gastrointestinal endoscopy  01/28/08  . Upper gastrointestinal endoscopy  08/21/04  . Coronary angioplasty with stent placement    . Polypectomy  03/10/2013    Dr. Britta Mccreedy    History   Social History  . Marital Status: Widowed    Spouse Name: N/A    Number of Children: N/A  . Years of Education: N/A   Occupational History  . Not on file.   Social History Main Topics  . Smoking status: Never Smoker   . Smokeless tobacco: Never Used  . Alcohol Use: No  . Drug Use: No  . Sexual Activity: Not on file   Other Topics Concern  . Not on file   Social History Narrative  . No narrative on file     Filed Vitals:   01/20/14 1507  BP: 112/63  Pulse: 72  Height: 5\' 11"  (1.803 m)  Weight: 249 lb (112.946 kg)    PHYSICAL EXAM General: NAD HEENT: Normal. Neck: No JVD, no thyromegaly. Lungs: Clear to auscultation bilaterally with normal respiratory effort. CV: Nondisplaced PMI.  Regular rate and rhythm, normal S1/S2, no S3/S4, no murmur. Trace pretibial and periankle edema.  No carotid bruit.  Normal pedal pulses.  Abdomen: Soft, nontender, no hepatosplenomegaly, no distention.  Neurologic: Alert and oriented x 3.  Psych: Normal affect. Skin: Normal. Musculoskeletal: Normal range of motion, no gross deformities. Extremities: No clubbing or cyanosis.   ECG: Most recent ECG reviewed.      ASSESSMENT AND PLAN: 1. Chest pain and dyspnea on exertion in context of CAD with multiple stents in the LCx and RCA/PDA: Given his most recent episode of chest pain in the context of known residual CAD, with progressive dyspnea on  exertion, I will obtain a Lexiscan Cardiolite stress test to evaluate for ischemia. Will reduce ASA from 325 mg to 81 mg daily. Not on statin therapy for unclear reasons. 2. Essential HTN: Appears controlled today on amlodipine 10 mg and valsartan 320 mg. No changes. 3. Chronic diastolic heart failure: Appears euvolemic and stable. Continue torsemide 50 mg bid. Offered compression stockings  but pt prefers not to wear them. 4. Type 2 diabetes: Currently on metformin. 5. CKD: Will see if BMET recently checked by PCP.  Dispo: f/u in 4-6 weeks.  Time spent: 40 minutes, of which >50% spent explaining pt's CAD and symptomatology, and rationale for further testing.  Kate Sable, M.D., F.A.C.C.

## 2014-01-28 ENCOUNTER — Encounter: Payer: Self-pay | Admitting: Cardiovascular Disease

## 2014-01-28 ENCOUNTER — Encounter: Payer: Self-pay | Admitting: *Deleted

## 2014-01-28 MED ORDER — ASPIRIN EC 81 MG PO TBEC
81.0000 mg | DELAYED_RELEASE_TABLET | Freq: Every day | ORAL | Status: AC
Start: 2014-01-28 — End: ?

## 2014-01-28 NOTE — Addendum Note (Signed)
Addended by: Laurine Blazer on: 01/28/2014 11:36 AM   Modules accepted: Orders, Medications

## 2014-01-28 NOTE — Patient Instructions (Addendum)
   Your physician has requested that you have a lexiscan myoview (stress test).  For further information please visit [email or web address]. Please follow instruction sheet, as given.  This test will be scheduled at [place] will contact with results via phone or letter.   Remain off the Toprol XL  Decrease Aspirin to 81mg  daily - may buy over the counter Continue all other medications.   Follow up in  4-6 weeks (see appointment above)   Phone call placed to patient & letter mailed.  Computer was down the day you were in the office.

## 2014-01-31 ENCOUNTER — Encounter: Payer: Self-pay | Admitting: Cardiovascular Disease

## 2014-02-07 ENCOUNTER — Encounter (HOSPITAL_COMMUNITY): Payer: Medicare Other

## 2014-02-07 ENCOUNTER — Inpatient Hospital Stay (HOSPITAL_COMMUNITY): Admission: RE | Admit: 2014-02-07 | Payer: Medicare Other | Source: Ambulatory Visit

## 2014-03-01 ENCOUNTER — Ambulatory Visit: Payer: Medicare Other | Admitting: Cardiovascular Disease

## 2014-04-17 ENCOUNTER — Other Ambulatory Visit: Payer: Self-pay | Admitting: Adult Health

## 2014-04-18 NOTE — Telephone Encounter (Signed)
escribed rx complete

## 2014-06-06 ENCOUNTER — Other Ambulatory Visit (INDEPENDENT_AMBULATORY_CARE_PROVIDER_SITE_OTHER): Payer: Self-pay | Admitting: Otolaryngology

## 2014-06-06 DIAGNOSIS — E079 Disorder of thyroid, unspecified: Secondary | ICD-10-CM

## 2014-06-09 ENCOUNTER — Ambulatory Visit (HOSPITAL_COMMUNITY): Payer: Medicare Other

## 2014-06-23 ENCOUNTER — Ambulatory Visit (INDEPENDENT_AMBULATORY_CARE_PROVIDER_SITE_OTHER): Payer: Medicare Other | Admitting: Otolaryngology

## 2014-10-04 ENCOUNTER — Other Ambulatory Visit: Payer: Self-pay | Admitting: Adult Health

## 2014-10-06 ENCOUNTER — Other Ambulatory Visit: Payer: Self-pay | Admitting: Adult Health

## 2015-02-24 ENCOUNTER — Other Ambulatory Visit: Payer: Self-pay | Admitting: Adult Health

## 2015-05-19 ENCOUNTER — Other Ambulatory Visit: Payer: Self-pay | Admitting: Cardiovascular Disease

## 2015-06-01 ENCOUNTER — Other Ambulatory Visit: Payer: Self-pay | Admitting: Cardiovascular Disease

## 2015-06-24 ENCOUNTER — Other Ambulatory Visit: Payer: Self-pay | Admitting: Adult Health

## 2015-07-31 DEATH — deceased
